# Patient Record
Sex: Male | Born: 1935 | Race: White | Hispanic: No | Marital: Married | Smoking: Never smoker
Health system: Southern US, Community
[De-identification: ages and names within clinical notes are randomized; demographics above are authoritative.]

## PROBLEM LIST (undated history)

## (undated) DIAGNOSIS — M6281 Muscle weakness (generalized): Secondary | ICD-10-CM

## (undated) DIAGNOSIS — R41841 Cognitive communication deficit: Secondary | ICD-10-CM

## (undated) DIAGNOSIS — E785 Hyperlipidemia, unspecified: Secondary | ICD-10-CM

## (undated) DIAGNOSIS — F0151 Vascular dementia with behavioral disturbance: Secondary | ICD-10-CM

## (undated) DIAGNOSIS — E119 Type 2 diabetes mellitus without complications: Secondary | ICD-10-CM

## (undated) DIAGNOSIS — H9190 Unspecified hearing loss, unspecified ear: Secondary | ICD-10-CM

## (undated) DIAGNOSIS — E079 Disorder of thyroid, unspecified: Secondary | ICD-10-CM

## (undated) DIAGNOSIS — Z9181 History of falling: Secondary | ICD-10-CM

## (undated) DIAGNOSIS — N200 Calculus of kidney: Secondary | ICD-10-CM

## (undated) DIAGNOSIS — F0153 Vascular dementia, unspecified severity, with mood disturbance: Secondary | ICD-10-CM

## (undated) DIAGNOSIS — M1990 Unspecified osteoarthritis, unspecified site: Secondary | ICD-10-CM

## (undated) DIAGNOSIS — I2510 Atherosclerotic heart disease of native coronary artery without angina pectoris: Secondary | ICD-10-CM

## (undated) DIAGNOSIS — R531 Weakness: Secondary | ICD-10-CM

## (undated) DIAGNOSIS — R2681 Unsteadiness on feet: Secondary | ICD-10-CM

## (undated) DIAGNOSIS — R1311 Dysphagia, oral phase: Secondary | ICD-10-CM

## (undated) DIAGNOSIS — F329 Major depressive disorder, single episode, unspecified: Secondary | ICD-10-CM

## (undated) DIAGNOSIS — F32A Depression, unspecified: Secondary | ICD-10-CM

## (undated) DIAGNOSIS — F0150 Vascular dementia without behavioral disturbance: Secondary | ICD-10-CM

## (undated) DIAGNOSIS — F03918 Unspecified dementia, unspecified severity, with other behavioral disturbance: Secondary | ICD-10-CM

## (undated) DIAGNOSIS — F0391 Unspecified dementia with behavioral disturbance: Secondary | ICD-10-CM

## (undated) DIAGNOSIS — N400 Enlarged prostate without lower urinary tract symptoms: Secondary | ICD-10-CM

## (undated) HISTORY — PX: TRANSURETHRAL RESECTION OF PROSTATE: SHX73

## (undated) HISTORY — PX: OTHER SURGICAL HISTORY: SHX169

## (undated) HISTORY — PX: TOTAL KNEE ARTHROPLASTY: SHX125

## (undated) HISTORY — PX: CORONARY ARTERY BYPASS GRAFT: SHX141

---

## 1997-08-17 ENCOUNTER — Encounter: Payer: Self-pay | Admitting: Cardiology

## 2001-04-14 ENCOUNTER — Ambulatory Visit (HOSPITAL_COMMUNITY): Admission: RE | Admit: 2001-04-14 | Discharge: 2001-04-14 | Payer: Self-pay | Admitting: Family Medicine

## 2001-04-14 ENCOUNTER — Encounter: Payer: Self-pay | Admitting: Family Medicine

## 2001-04-26 ENCOUNTER — Ambulatory Visit (HOSPITAL_COMMUNITY): Admission: RE | Admit: 2001-04-26 | Discharge: 2001-04-26 | Payer: Self-pay | Admitting: Urology

## 2001-04-26 ENCOUNTER — Encounter: Payer: Self-pay | Admitting: Urology

## 2001-05-04 ENCOUNTER — Ambulatory Visit (HOSPITAL_COMMUNITY): Admission: RE | Admit: 2001-05-04 | Discharge: 2001-05-04 | Payer: Self-pay | Admitting: Urology

## 2001-05-09 ENCOUNTER — Encounter: Payer: Self-pay | Admitting: Urology

## 2001-05-09 ENCOUNTER — Ambulatory Visit (HOSPITAL_COMMUNITY): Admission: RE | Admit: 2001-05-09 | Discharge: 2001-05-09 | Payer: Self-pay

## 2001-06-24 ENCOUNTER — Encounter: Payer: Self-pay | Admitting: Urology

## 2001-06-24 ENCOUNTER — Ambulatory Visit (HOSPITAL_COMMUNITY): Admission: RE | Admit: 2001-06-24 | Discharge: 2001-06-24 | Payer: Self-pay | Admitting: Urology

## 2001-09-21 ENCOUNTER — Ambulatory Visit (HOSPITAL_COMMUNITY): Admission: RE | Admit: 2001-09-21 | Discharge: 2001-09-21 | Payer: Self-pay | Admitting: Urology

## 2001-09-21 ENCOUNTER — Encounter: Payer: Self-pay | Admitting: Urology

## 2001-09-28 ENCOUNTER — Encounter: Payer: Self-pay | Admitting: Urology

## 2001-09-28 ENCOUNTER — Ambulatory Visit (HOSPITAL_COMMUNITY): Admission: RE | Admit: 2001-09-28 | Discharge: 2001-09-28 | Payer: Self-pay | Admitting: Urology

## 2001-12-01 ENCOUNTER — Encounter: Payer: Self-pay | Admitting: Cardiology

## 2001-12-27 ENCOUNTER — Ambulatory Visit (HOSPITAL_COMMUNITY): Admission: RE | Admit: 2001-12-27 | Discharge: 2001-12-27 | Payer: Self-pay | Admitting: Cardiology

## 2002-02-07 ENCOUNTER — Encounter: Payer: Self-pay | Admitting: Family Medicine

## 2002-02-07 ENCOUNTER — Ambulatory Visit (HOSPITAL_COMMUNITY): Admission: RE | Admit: 2002-02-07 | Discharge: 2002-02-07 | Payer: Self-pay | Admitting: Family Medicine

## 2002-05-25 ENCOUNTER — Ambulatory Visit (HOSPITAL_COMMUNITY): Admission: RE | Admit: 2002-05-25 | Discharge: 2002-05-25 | Payer: Self-pay | Admitting: Internal Medicine

## 2003-08-15 ENCOUNTER — Ambulatory Visit (HOSPITAL_COMMUNITY): Admission: RE | Admit: 2003-08-15 | Discharge: 2003-08-15 | Payer: Self-pay | Admitting: Family Medicine

## 2003-10-09 ENCOUNTER — Ambulatory Visit (HOSPITAL_COMMUNITY): Admission: RE | Admit: 2003-10-09 | Discharge: 2003-10-09 | Payer: Self-pay | Admitting: Family Medicine

## 2003-10-17 ENCOUNTER — Ambulatory Visit (HOSPITAL_COMMUNITY): Admission: RE | Admit: 2003-10-17 | Discharge: 2003-10-17 | Payer: Self-pay | Admitting: Family Medicine

## 2003-11-07 ENCOUNTER — Encounter: Admission: RE | Admit: 2003-11-07 | Discharge: 2003-11-07 | Payer: Self-pay | Admitting: Orthopedic Surgery

## 2003-11-22 ENCOUNTER — Encounter: Admission: RE | Admit: 2003-11-22 | Discharge: 2003-11-22 | Payer: Self-pay | Admitting: Orthopedic Surgery

## 2003-11-27 ENCOUNTER — Ambulatory Visit (HOSPITAL_COMMUNITY): Admission: RE | Admit: 2003-11-27 | Discharge: 2003-11-27 | Payer: Self-pay | Admitting: Family Medicine

## 2003-11-30 ENCOUNTER — Ambulatory Visit (HOSPITAL_COMMUNITY): Admission: RE | Admit: 2003-11-30 | Discharge: 2003-11-30 | Payer: Self-pay | Admitting: Family Medicine

## 2003-12-12 ENCOUNTER — Encounter: Admission: RE | Admit: 2003-12-12 | Discharge: 2003-12-12 | Payer: Self-pay | Admitting: Orthopedic Surgery

## 2004-02-13 ENCOUNTER — Ambulatory Visit (HOSPITAL_COMMUNITY): Admission: RE | Admit: 2004-02-13 | Discharge: 2004-02-13 | Payer: Self-pay | Admitting: Family Medicine

## 2004-09-02 ENCOUNTER — Ambulatory Visit (HOSPITAL_COMMUNITY): Admission: RE | Admit: 2004-09-02 | Discharge: 2004-09-02 | Payer: Self-pay | Admitting: Family Medicine

## 2004-09-23 ENCOUNTER — Ambulatory Visit (HOSPITAL_COMMUNITY): Admission: RE | Admit: 2004-09-23 | Discharge: 2004-09-23 | Payer: Self-pay | Admitting: Family Medicine

## 2004-09-23 IMAGING — CT CT HEAD W/O CM
1 series · 15 of 30 positions shown, 19 images · non-contrast
Comparison: none

CLINICAL DATA: New left sided weakness.  
CT HEAD WITHOUT CONTRAST
Multidetector helical CT scanning obtained from the skull base to the vertex.
Moderate to severe decreased attenuation of periventricular white matter is compatible with chronic ischemic changes of a microvascular nature.  Remote lacunar infarcts in bilateral cerebellum, bilateral basal ganglia and thalami are noted.  Mild to moderate generalized cerebral and cerebellar volume loss is noted.  No definite acute abnormality identified including mass or mass effect, hydrocephalus, extraaxial fluid collection, midline shift, hemorrhage, acute infarct. Acute infarct may be missed by CT for 24-48 hours.  Atherosclerotic calcification in the vertebral basilar and visualized internal carotid arteries are noted.  Visualized bony calvarium and paranasal sinuses are unremarkable.  There is fluid in scattered right mastoid air cells.
IMPRESSION
1.  No definite acute intracranial abnormality.  
2.  Remote lacunar infarcts in bilateral basal ganglia, thalami, and cerebellum.
3.  Mild to moderate atrophy and moderate to severe chronic small vessel white matter changes.
4.  Small amount of fluid in right mastoid air cells.

[Series 9234: — · axial · 0.49mm/px · z∈[-618,-483]mm · 15 of 30 slices shown, 19 images]
[im 2/30  brain]
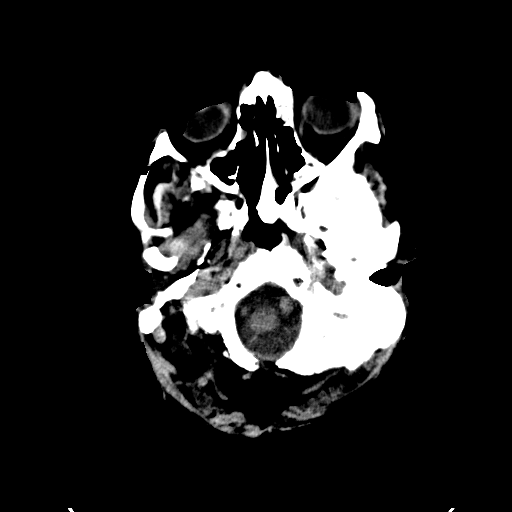
[im 2/30  bone]
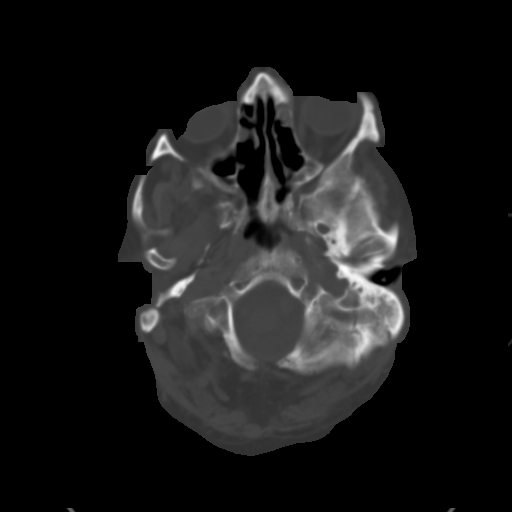
[im 4/30  brain]
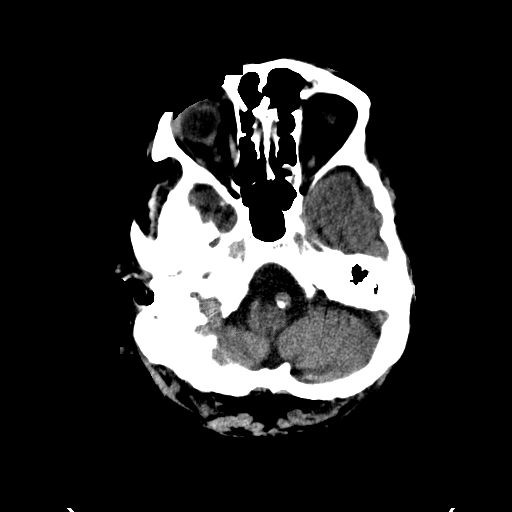
[im 6/30  brain]
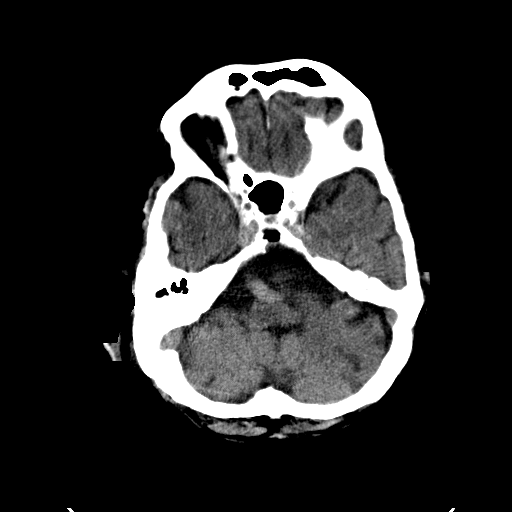
[im 8/30  brain]
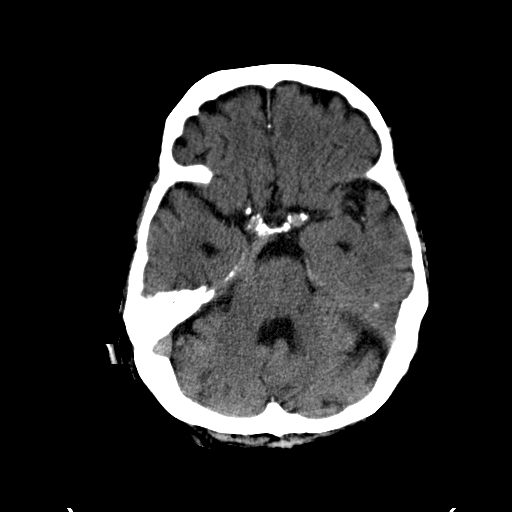
[im 10/30  brain]
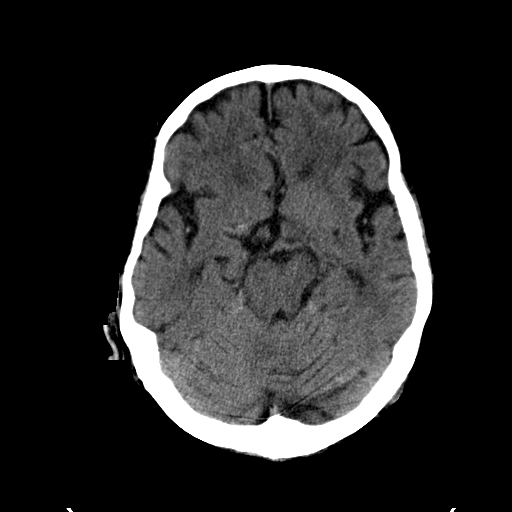
[im 10/30  bone]
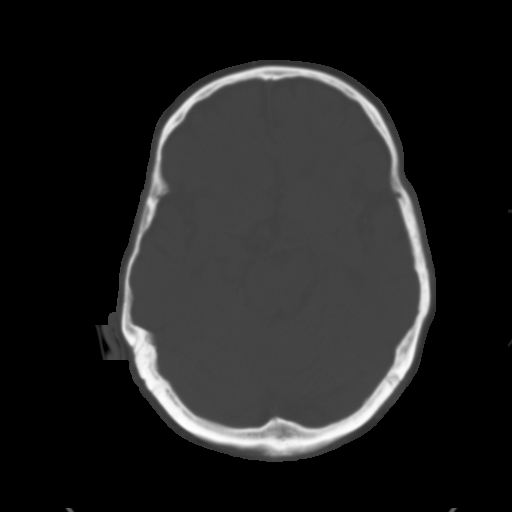
[im 12/30  brain]
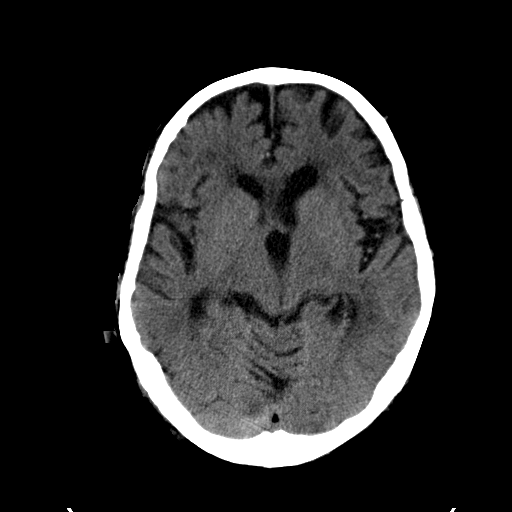
[im 14/30  brain]
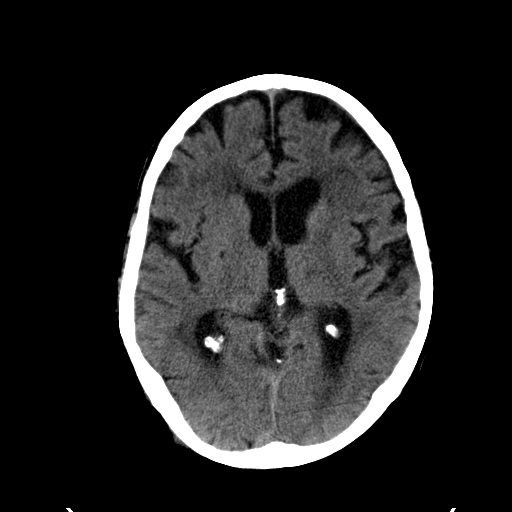
[im 16/30  brain]
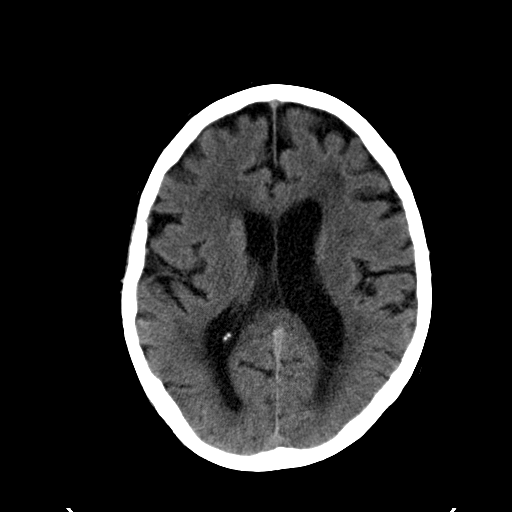
[im 17/30  brain]
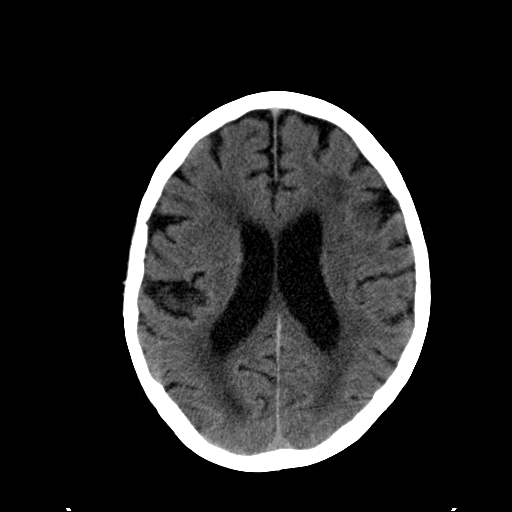
[im 17/30  bone]
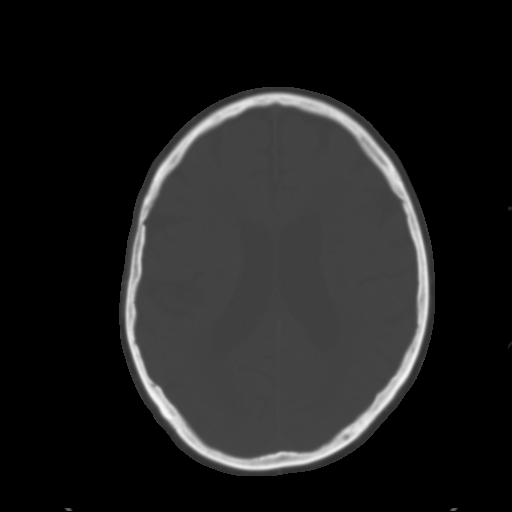
[im 19/30  brain]
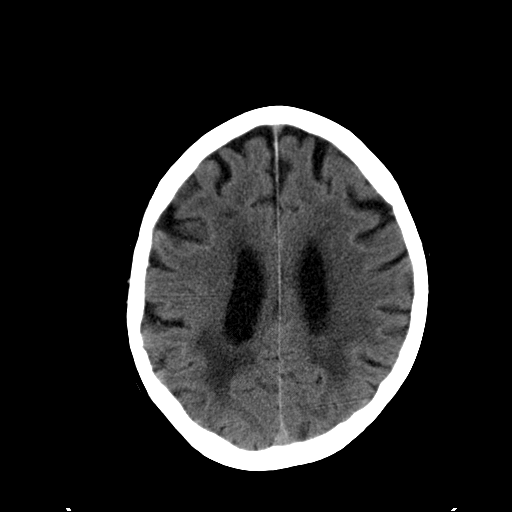
[im 21/30  brain]
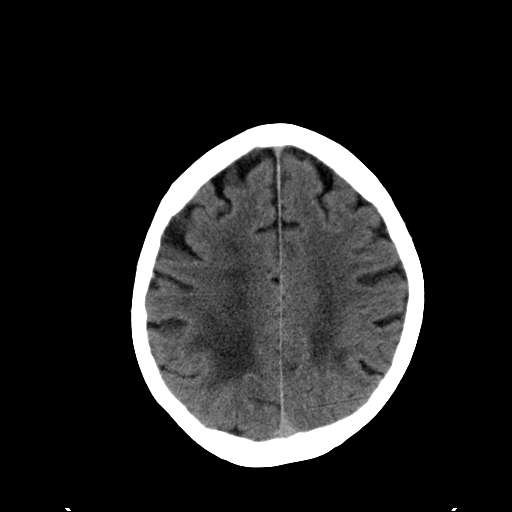
[im 23/30  brain]
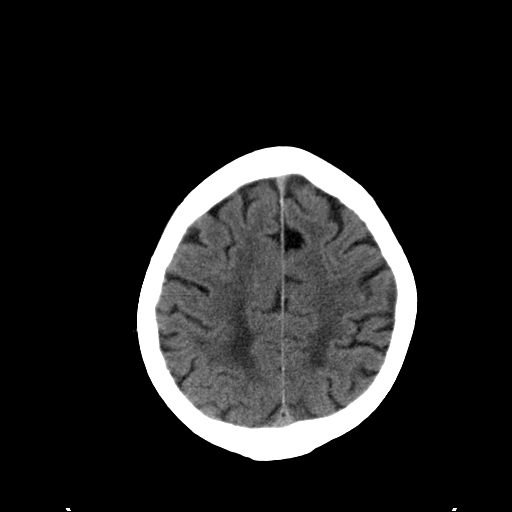
[im 25/30  brain]
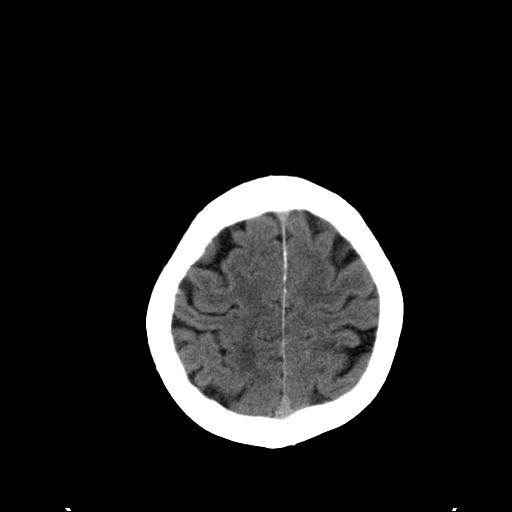
[im 25/30  bone]
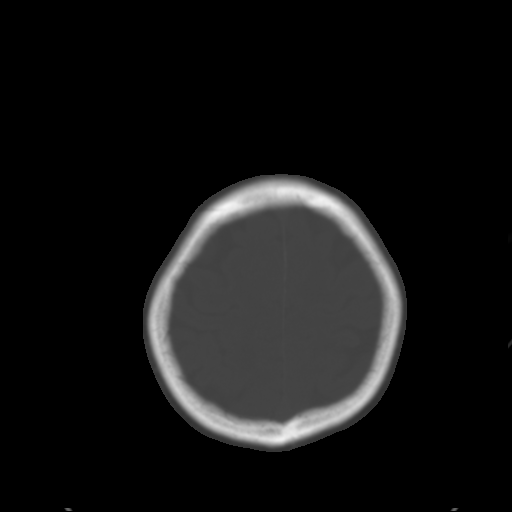
[im 27/30  brain]
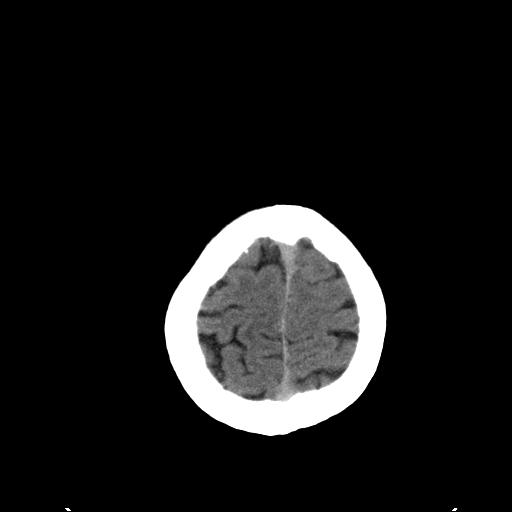
[im 29/30  brain]
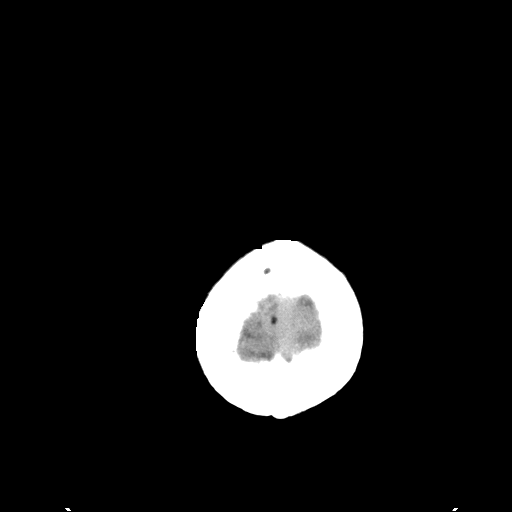

[15 of 30 positions shown; findings below may reference images not displayed]

## 2004-09-30 ENCOUNTER — Ambulatory Visit (HOSPITAL_COMMUNITY): Admission: RE | Admit: 2004-09-30 | Discharge: 2004-09-30 | Payer: Self-pay | Admitting: Urology

## 2005-07-17 ENCOUNTER — Ambulatory Visit: Payer: Self-pay | Admitting: *Deleted

## 2005-07-28 IMAGING — CT CT ABDOMEN W/O CM
1 of 2 series · 15 of 32 positions shown, 19 images · non-contrast
Comparison: none

HISTORY: Left flank pain, history of kidney stones

[Series 8423: — · axial · 0.74mm/px · z∈[+1306,+1676]mm · 15 of 83 slices shown, 19 images]
[im 6/83  soft-tissue]
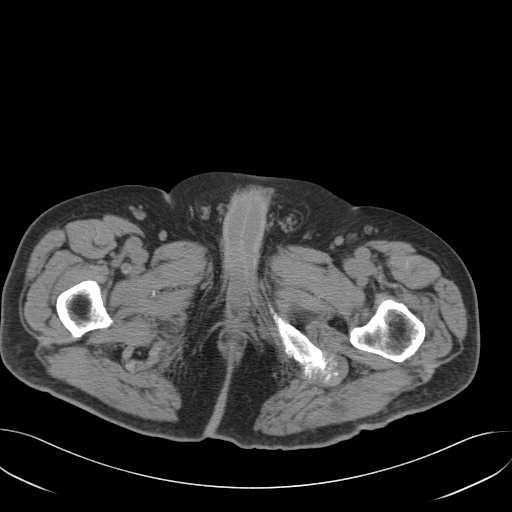
[im 6/83  bone]
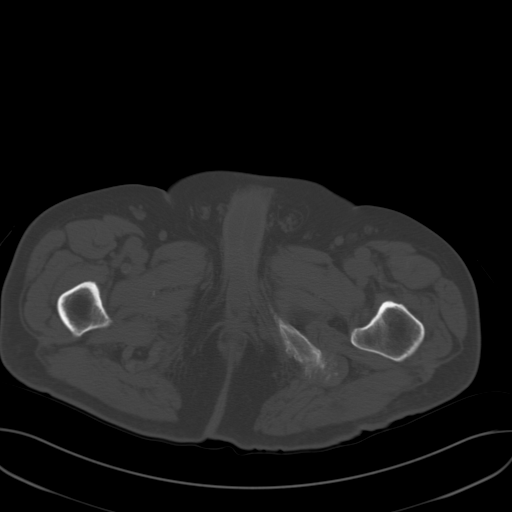
[im 12/83  soft-tissue]
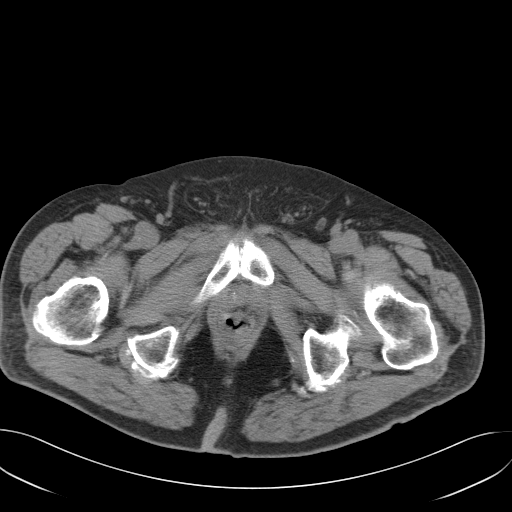
[im 18/83  soft-tissue]
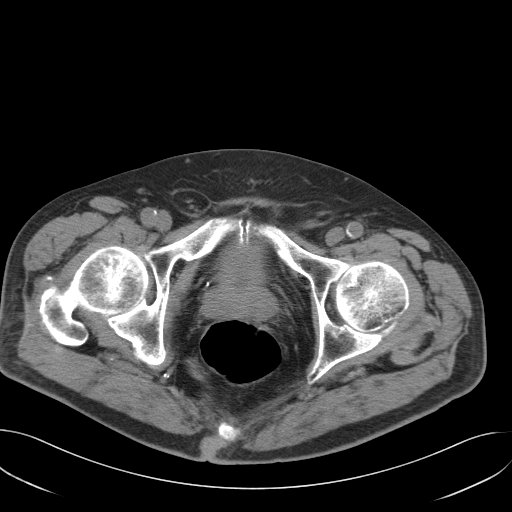
[im 24/83  soft-tissue]
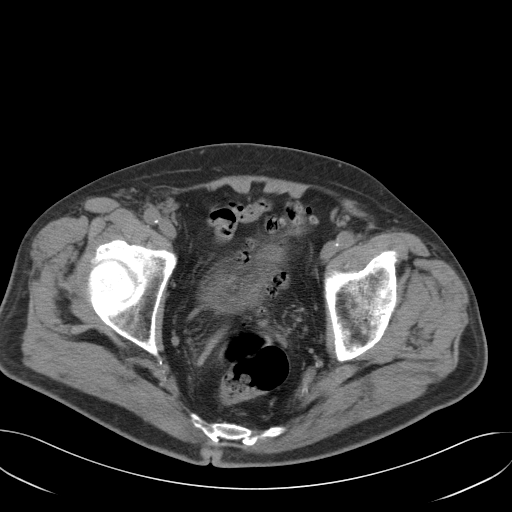
[im 30/83  soft-tissue]
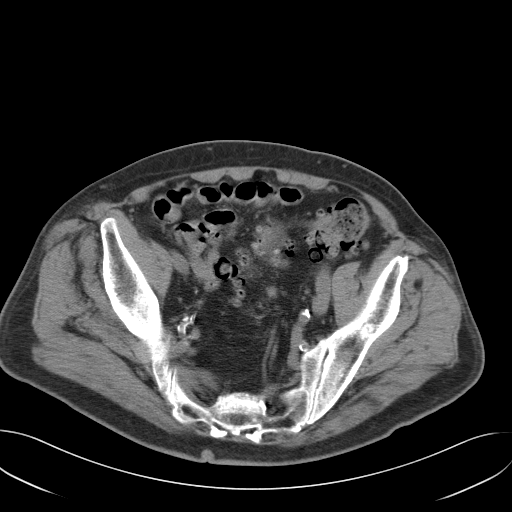
[im 36/83  soft-tissue]
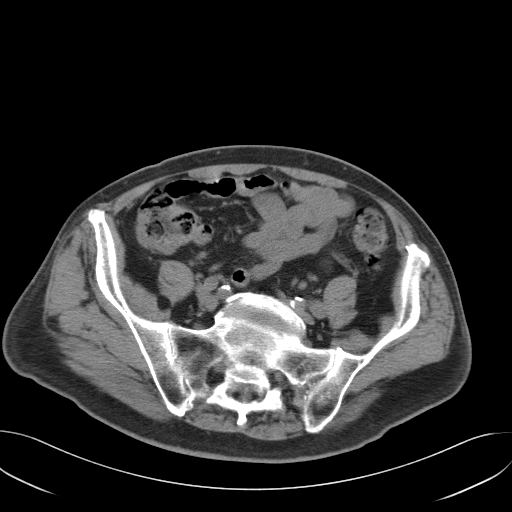
[im 42/83  soft-tissue]
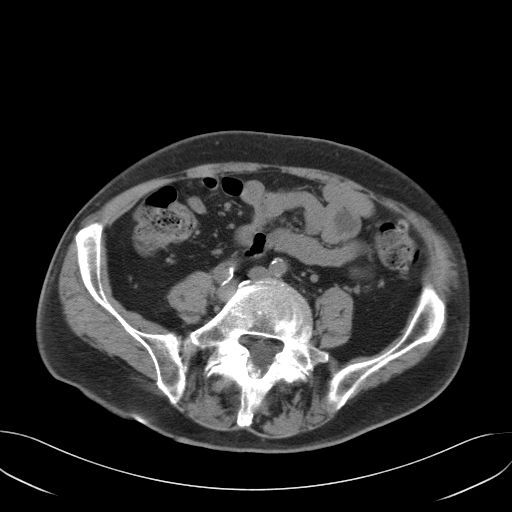
[im 47/83  soft-tissue]
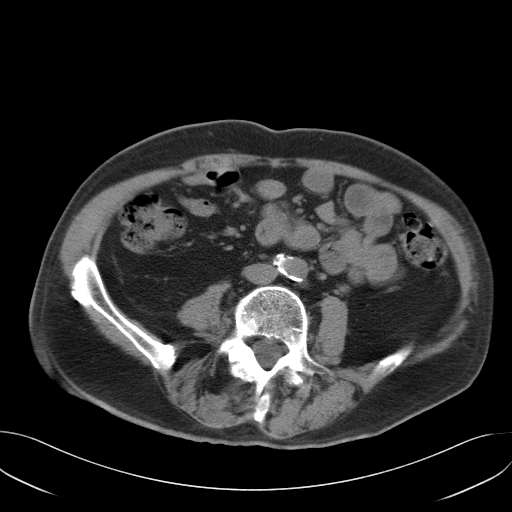
[im 53/83  soft-tissue]
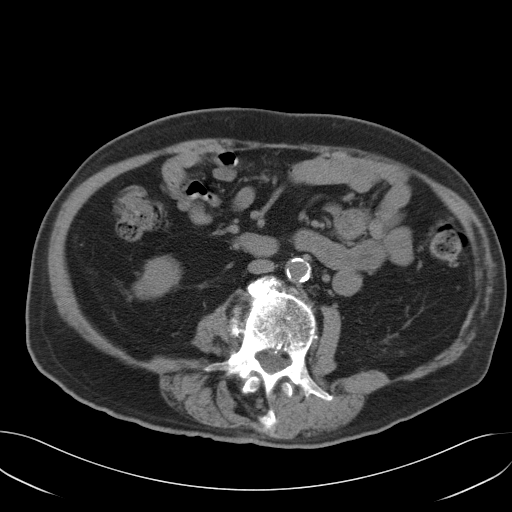
[im 53/83  bone]
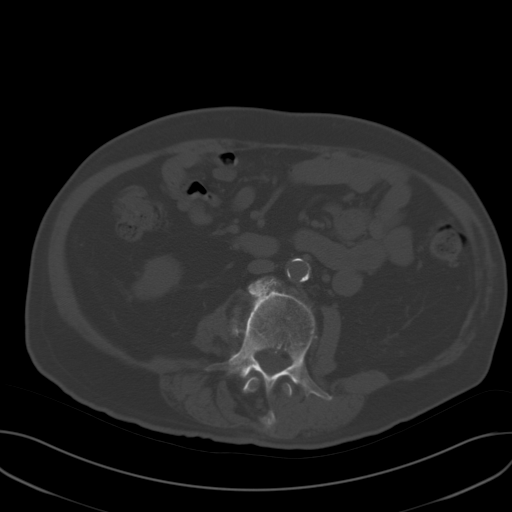
[im 59/83  soft-tissue]
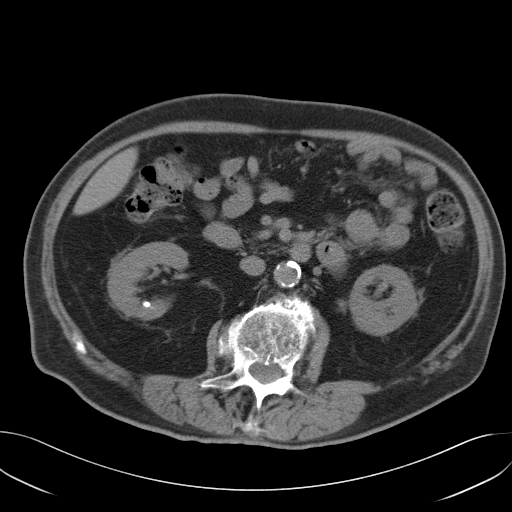
[im 65/83  soft-tissue]
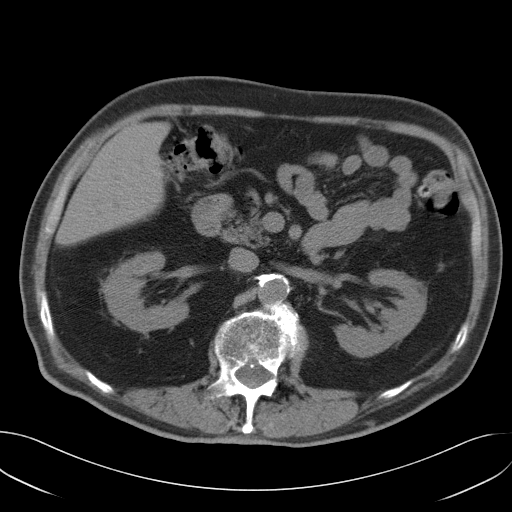
[im 71/83  soft-tissue]
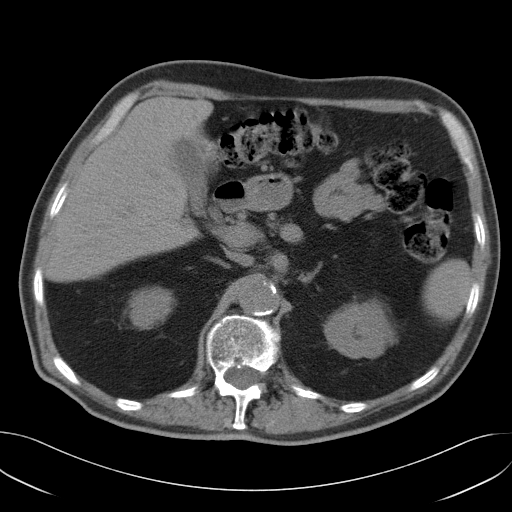
[im 71/83  lung]
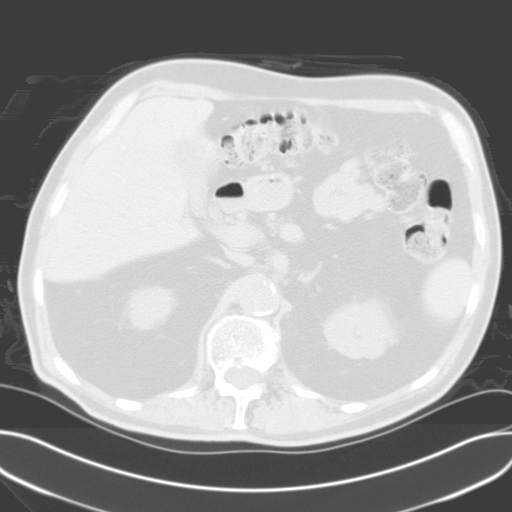
[im 74/83  lung]
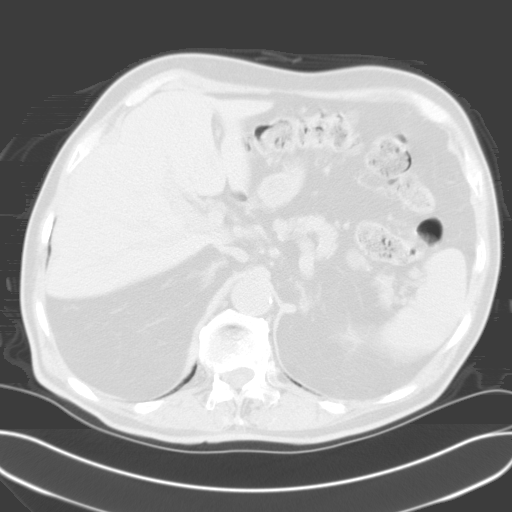
[im 77/83  soft-tissue]
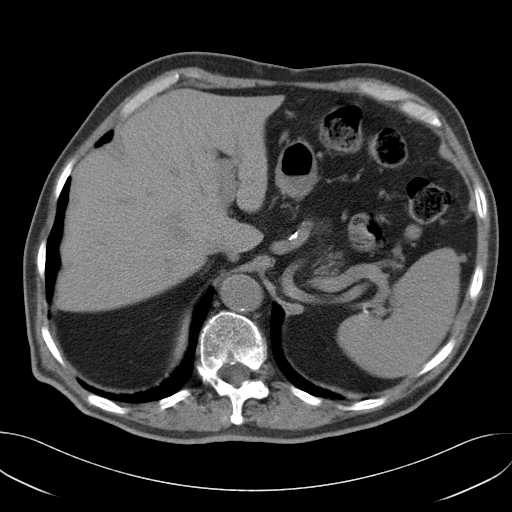
[im 77/83  lung]
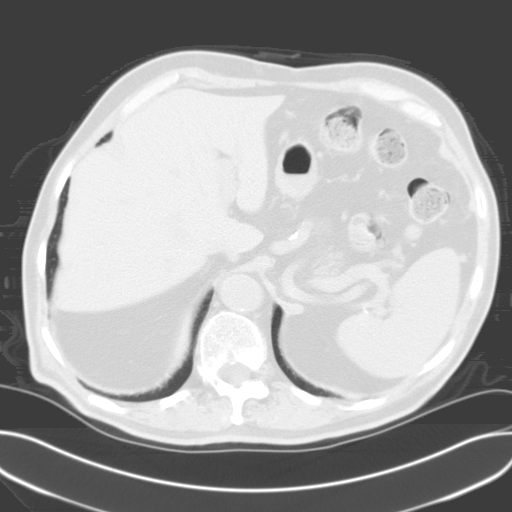
[im 80/83  lung]
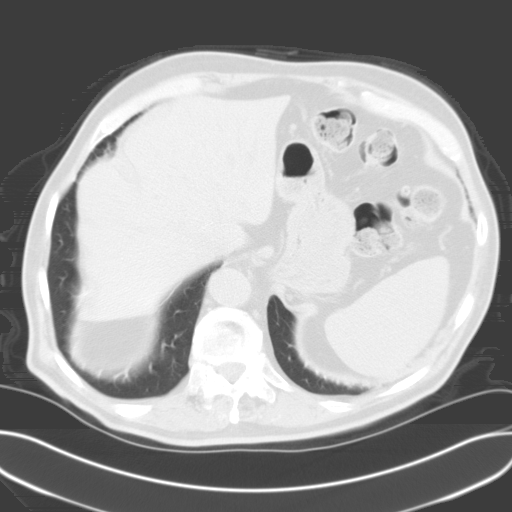

[15 of 32 positions shown; findings below may reference images not displayed]

CT ABDOMEN AND PELVIS WITHOUT CONTRAST:

Multidetector helical CT imaging of abdomen and pelvis performed using kidney
stone protocol. 
Neither oral nor intravenous contrast utilized for this indication.

Comparison 08/15/2003

CT ABDOMEN:

Small hiatal hernia.
2 right renal calculi without hydronephrosis.
No left renal calculi or hydronephrosis.
No ureteral dilatation or perinephric fluid collection.
Scattered atherosclerotic calcifications.
Remaining solid organs and bowel loops unremarkable for exam lacking IV and oral
contrast.
IMPRESSION: Right renal calculi. No acute abnormalities.

CT PELVIS:

Extensive diverticulosis sigmoid colon.
No definite pericolic inflammatory changes to suggest acute diverticulitis.
No distal ureteral calcification or dilatation.
Question minimal hazy infiltration of fat planes between the sigmoid colon and
superior aspect of bladder, unchanged since the previous exam.
Linear low attenuation identified in left lateral wall of urinary bladder,
unchanged, measuring fat in attenuation, a nonspecific finding of uncertain
significance.
Extensive diverticulitis changes of sigmoid colon seen on previous exam no
longer identified.
IMPRESSION: Extensive sigmoid diverticulosis without signs of diverticulitis.
No distal ureteral calcification, dilatation or hydronephrosis.
Unusual appearance of left lateral bladder wall, stable.
Minimal chronic stranding of pericolic and perivesicular fat planes in pelvis.

## 2006-10-08 ENCOUNTER — Ambulatory Visit: Payer: Self-pay | Admitting: Cardiovascular Disease

## 2008-03-29 ENCOUNTER — Ambulatory Visit (HOSPITAL_COMMUNITY): Admission: RE | Admit: 2008-03-29 | Discharge: 2008-03-29 | Payer: Self-pay | Admitting: Family Medicine

## 2008-08-14 ENCOUNTER — Ambulatory Visit (HOSPITAL_COMMUNITY): Admission: RE | Admit: 2008-08-14 | Discharge: 2008-08-14 | Payer: Self-pay | Admitting: Family Medicine

## 2008-09-24 ENCOUNTER — Ambulatory Visit (HOSPITAL_COMMUNITY): Admission: RE | Admit: 2008-09-24 | Discharge: 2008-09-24 | Payer: Self-pay | Admitting: Family Medicine

## 2009-02-26 DIAGNOSIS — I2119 ST elevation (STEMI) myocardial infarction involving other coronary artery of inferior wall: Secondary | ICD-10-CM | POA: Insufficient documentation

## 2009-02-26 DIAGNOSIS — Z951 Presence of aortocoronary bypass graft: Secondary | ICD-10-CM

## 2009-02-26 DIAGNOSIS — Z794 Long term (current) use of insulin: Secondary | ICD-10-CM

## 2009-02-26 DIAGNOSIS — E785 Hyperlipidemia, unspecified: Secondary | ICD-10-CM

## 2009-02-26 DIAGNOSIS — E119 Type 2 diabetes mellitus without complications: Secondary | ICD-10-CM

## 2009-08-19 ENCOUNTER — Ambulatory Visit (HOSPITAL_COMMUNITY): Admission: RE | Admit: 2009-08-19 | Discharge: 2009-08-19 | Payer: Self-pay | Admitting: Family Medicine

## 2010-02-28 ENCOUNTER — Ambulatory Visit (HOSPITAL_COMMUNITY): Admission: RE | Admit: 2010-02-28 | Discharge: 2010-02-28 | Payer: Self-pay | Admitting: Family Medicine

## 2010-04-03 ENCOUNTER — Ambulatory Visit (HOSPITAL_COMMUNITY): Admission: RE | Admit: 2010-04-03 | Discharge: 2010-04-03 | Payer: Self-pay | Admitting: Family Medicine

## 2010-07-05 ENCOUNTER — Emergency Department (HOSPITAL_COMMUNITY)
Admission: EM | Admit: 2010-07-05 | Discharge: 2010-07-05 | Payer: Self-pay | Source: Home / Self Care | Admitting: Emergency Medicine

## 2010-07-08 ENCOUNTER — Ambulatory Visit (HOSPITAL_COMMUNITY): Admission: RE | Admit: 2010-07-08 | Discharge: 2010-07-08 | Payer: Self-pay | Admitting: Urology

## 2010-07-15 ENCOUNTER — Ambulatory Visit (HOSPITAL_COMMUNITY): Admission: RE | Admit: 2010-07-15 | Discharge: 2010-07-15 | Payer: Self-pay | Admitting: Urology

## 2010-09-21 ENCOUNTER — Encounter: Payer: Self-pay | Admitting: Family Medicine

## 2010-11-11 LAB — URINE MICROSCOPIC-ADD ON

## 2010-11-11 LAB — BASIC METABOLIC PANEL
Calcium: 9.5 mg/dL (ref 8.4–10.5)
Calcium: 9.8 mg/dL (ref 8.4–10.5)
Chloride: 100 mEq/L (ref 96–112)
GFR calc Af Amer: 46 mL/min — ABNORMAL LOW (ref >60)
GFR calc non Af Amer: 30 mL/min — ABNORMAL LOW (ref >60)
Glucose, Bld: 143 mg/dL — ABNORMAL HIGH (ref 70–99)
Glucose, Bld: 158 mg/dL — ABNORMAL HIGH (ref 70–99)
Potassium: 4.6 mEq/L (ref 3.5–5.1)

## 2010-11-11 LAB — URINALYSIS, ROUTINE W REFLEX MICROSCOPIC
Glucose, UA: NEGATIVE mg/dL
Ketones, ur: NEGATIVE mg/dL
Nitrite: NEGATIVE
Urobilinogen, UA: 0.2 mg/dL (ref 0.0–1.0)

## 2010-11-11 LAB — CBC
Hemoglobin: 10.6 g/dL — ABNORMAL LOW (ref 13.0–17.0)
Hemoglobin: 9.8 g/dL — ABNORMAL LOW (ref 13.0–17.0)
MCH: 28.8 pg (ref 26.0–34.0)
MCH: 29.2 pg (ref 26.0–34.0)
RBC: 3.62 MIL/uL — ABNORMAL LOW (ref 4.22–5.81)
RDW: 15.2 % (ref 11.5–15.5)
RDW: 15.4 % (ref 11.5–15.5)
WBC: 5.3 10*3/uL (ref 4.0–10.5)

## 2010-11-11 LAB — DIFFERENTIAL
Eosinophils Relative: 1 % (ref 0–5)
Lymphocytes Relative: 12 % (ref 12–46)
Monocytes Absolute: 0.6 10*3/uL (ref 0.1–1.0)
Monocytes Relative: 7 % (ref 3–12)
Neutro Abs: 6.3 10*3/uL (ref 1.7–7.7)
Neutrophils Relative %: 80 % — ABNORMAL HIGH (ref 43–77)

## 2010-11-11 LAB — GLUCOSE, CAPILLARY: Glucose-Capillary: 108 mg/dL — ABNORMAL HIGH (ref 70–99)

## 2010-11-11 LAB — SURGICAL PCR SCREEN: MRSA, PCR: NEGATIVE

## 2011-01-16 NOTE — Assessment & Plan Note (Signed)
[facility]                       [place] [facility] OFFICE NOTE   NAME:Servais, Kojo                       MRN:          [id number]  DATE:10/08/2006                            DOB:          11-14-1920    Mr. Rodier is seen today as a new patient to me.  He has had previous  bypass surgery in August 1998.  This included LIMA to the LAD, vein  graft to OM1 and 2, vein graft to PDA and vein graft to the diagonal.  His last Myoview was five years ago and was negative for ischemia with  an EF of 47%.  The patient is a diabetic.  He sees Dr. Renard Matter.  He was  asking me about his Avandia.  He has been on it for four or five years.  The primary problem with Avandia would be fluid retention and, given his  advanced age and the fact that he has tolerated this drug over the last  four to five years, I do not think I would necessarily change it.  Similarly, he has been on Vytorin and I do not think that he should stop  the Zetia.   From a cardiac perspective, he is not having significant chest pain, PND  or orthopnea.  He has not had any significant palpitations.   MEDICATIONS INCLUDE:  1. Avandia 8 mg a day.  2. An aspirin a day.  3. Vytorin 10/40.  4. Avalide 150/12.5.  5. Metformin 1 g a day.   Today, in the office, his blood pressure was 130/70, pulse was 80 and  regular.  HEENT is normal.  There are no carotid bruits.  There is an  S1, S2, normal heart sounds.  Abdomen is benign.  Lower extremities with  intact pulses, no edema.   IMPRESSION:  Stable, status post CABG in 1998.  The patient is 77 years  old.  He is asymptomatic, although he is a diabetic and I am tempted to  order a stress test on him.  I will see him back in six months.  It is  difficult to make the patient feel any better than he already is.  We  will continue aggressive secondary prophylaxis.  He will continue his  aspirin therapy.  He will continue Vytorin for his  hyperlipidemia.   His last LDL cholesterol that I have on file was 90.   In regards to his medication, we will not stop his Zetia or Avandia,  since he has tolerated them for many years.   He knows to call us if he were to have any significant exertional  angina.   Overall, for his age, I think he is doing very well.     Noralyn Pick. Eden Emms, MD, [facility]  Electronically Signed    PCN/MedQ  DD: 10/08/2006  DT: 10/08/2006  Job #: [id number]

## 2011-01-16 NOTE — Procedures (Signed)
NAME:  James Pruitt, James Pruitt                          ACCOUNT NO.:  [id number]   MEDICAL RECORD NO.:  [id number]                   PATIENT TYPE:  OUT   LOCATION:  RAD                                  FACILITY:  [facility]   PHYSICIAN:  Vida Roller, M.D.                DATE OF BIRTH:  September 11, 1920   DATE OF PROCEDURE:  DATE OF DISCHARGE:                                  ECHOCARDIOGRAM   TAPE NUMBER:  [id number], tape count [id number] through 2926.   INDICATION:  An 77 year old man with a TIA.   TECHNICAL QUALITY:  Adequate.   M-MODE TRACING:  1. The aorta is 39 mm at the area below the sinotubular junction.  2. The left atrium is 37 mm.  3. The septum is 20 mm.  4. Posterior wall is 13 mm.  5. Left ventricular diastolic dimension is 42 mm.  6. Left ventricular systolic dimension is 29 mm.   2-D AND DOPPLER IMAGING:  1. The left ventricle is normal size with mild upper septal hypertrophy.     There is also mild concentric ventricular hypertrophy.  The overall     ejection fraction appears to be preserved at 55 to 60 percent.  There is     hypokinesis of the mid to distal septum seen best in the apical three-     chamber view.  2. The right ventricle is normal size with normal systolic function.  3. Both atria are top normal in size.  4. The aortic valve is sclerotic.  There is evidence of mild insufficiency.     No stenosis is seen.  5. The mitral valve is mildly calcified with no significant stenosis or     regurgitation.  6. The tricuspid valve has mild insufficiency.  7. The pulmonic valve was not well seen.  8. The pericardial structures appeared normal.  9. The inferior vena cava appeared to be normal size.  10.      The ascending aorta appeared to be dilated.  In the suprasternal     notch view, it is difficult to assess, but it appears to be dilated to     the arch.  I was unable to measure however.  Recommendation would be for     a CT scan to assess further.      ___________________________________________                                            Vida Roller, M.D.   JH/MEDQ  D:  11/30/2003  T:  11/30/2003  Job:  [id number]

## 2012-02-05 ENCOUNTER — Ambulatory Visit (HOSPITAL_COMMUNITY)
Admission: RE | Admit: 2012-02-05 | Discharge: 2012-02-05 | Disposition: A | Discharge status: Home / Self Care | Payer: Medicare Other | Source: Ambulatory Visit | Attending: Family Medicine | Admitting: Family Medicine

## 2012-02-05 ENCOUNTER — Other Ambulatory Visit (HOSPITAL_COMMUNITY): Payer: Self-pay | Admitting: Family Medicine

## 2012-02-05 DIAGNOSIS — R05 Cough: Secondary | ICD-10-CM

## 2012-02-05 DIAGNOSIS — R0602 Shortness of breath: Secondary | ICD-10-CM | POA: Insufficient documentation

## 2012-02-05 DIAGNOSIS — R059 Cough, unspecified: Secondary | ICD-10-CM | POA: Insufficient documentation

## 2012-02-05 DIAGNOSIS — R0600 Dyspnea, unspecified: Secondary | ICD-10-CM

## 2012-03-17 ENCOUNTER — Ambulatory Visit (HOSPITAL_COMMUNITY)
Admission: RE | Admit: 2012-03-17 | Discharge: 2012-03-17 | Disposition: A | Discharge status: Home / Self Care | Payer: Medicare Other | Source: Ambulatory Visit | Attending: Family Medicine | Admitting: Family Medicine

## 2012-03-17 ENCOUNTER — Other Ambulatory Visit (HOSPITAL_COMMUNITY): Payer: Self-pay | Admitting: Family Medicine

## 2012-03-17 DIAGNOSIS — S63509A Unspecified sprain of unspecified wrist, initial encounter: Secondary | ICD-10-CM | POA: Insufficient documentation

## 2012-03-17 DIAGNOSIS — G8929 Other chronic pain: Secondary | ICD-10-CM

## 2012-03-17 DIAGNOSIS — X58XXXA Exposure to other specified factors, initial encounter: Secondary | ICD-10-CM | POA: Insufficient documentation

## 2012-09-05 ENCOUNTER — Encounter (HOSPITAL_COMMUNITY): Payer: Self-pay | Admitting: Pharmacy Technician

## 2012-09-12 ENCOUNTER — Encounter (HOSPITAL_COMMUNITY): Admission: RE | Payer: Self-pay | Source: Ambulatory Visit

## 2012-09-12 ENCOUNTER — Ambulatory Visit (HOSPITAL_COMMUNITY): Admission: RE | Admit: 2012-09-12 | Payer: Medicare Other | Source: Ambulatory Visit | Admitting: Ophthalmology

## 2012-09-12 SURGERY — PHACOEMULSIFICATION, CATARACT, WITH IOL INSERTION
Anesthesia: Monitor Anesthesia Care | Site: Eye | Laterality: Left

## 2012-10-27 ENCOUNTER — Encounter (HOSPITAL_COMMUNITY): Payer: Self-pay | Admitting: *Deleted

## 2012-10-27 ENCOUNTER — Emergency Department (HOSPITAL_COMMUNITY)
Admission: EM | Admit: 2012-10-27 | Discharge: 2012-10-27 | Disposition: A | Discharge status: Home / Self Care | Payer: Medicare Other | Attending: Emergency Medicine | Admitting: Emergency Medicine

## 2012-10-27 ENCOUNTER — Emergency Department (HOSPITAL_COMMUNITY): Payer: Medicare Other

## 2012-10-27 DIAGNOSIS — K4090 Unilateral inguinal hernia, without obstruction or gangrene, not specified as recurrent: Secondary | ICD-10-CM | POA: Insufficient documentation

## 2012-10-27 DIAGNOSIS — I10 Essential (primary) hypertension: Secondary | ICD-10-CM | POA: Insufficient documentation

## 2012-10-27 DIAGNOSIS — Z87442 Personal history of urinary calculi: Secondary | ICD-10-CM | POA: Insufficient documentation

## 2012-10-27 DIAGNOSIS — R1084 Generalized abdominal pain: Secondary | ICD-10-CM | POA: Insufficient documentation

## 2012-10-27 DIAGNOSIS — Z87448 Personal history of other diseases of urinary system: Secondary | ICD-10-CM | POA: Insufficient documentation

## 2012-10-27 DIAGNOSIS — E785 Hyperlipidemia, unspecified: Secondary | ICD-10-CM | POA: Insufficient documentation

## 2012-10-27 DIAGNOSIS — Z79899 Other long term (current) drug therapy: Secondary | ICD-10-CM | POA: Insufficient documentation

## 2012-10-27 DIAGNOSIS — Z7982 Long term (current) use of aspirin: Secondary | ICD-10-CM | POA: Insufficient documentation

## 2012-10-27 DIAGNOSIS — E119 Type 2 diabetes mellitus without complications: Secondary | ICD-10-CM | POA: Insufficient documentation

## 2012-10-27 DIAGNOSIS — N289 Disorder of kidney and ureter, unspecified: Secondary | ICD-10-CM | POA: Insufficient documentation

## 2012-10-27 HISTORY — DX: Hyperlipidemia, unspecified: E78.5

## 2012-10-27 HISTORY — DX: Calculus of kidney: N20.0

## 2012-10-27 HISTORY — DX: Type 2 diabetes mellitus without complications: E11.9

## 2012-10-27 HISTORY — DX: Benign prostatic hyperplasia without lower urinary tract symptoms: N40.0

## 2012-10-27 LAB — CBC WITH DIFFERENTIAL/PLATELET
Eosinophils Absolute: 0.1 10*3/uL (ref 0.0–0.7)
HCT: 40.5 % (ref 39.0–52.0)
Hemoglobin: 13.5 g/dL (ref 13.0–17.0)
Lymphocytes Relative: 18 % (ref 12–46)
MCH: 30.9 pg (ref 26.0–34.0)
MCHC: 33.3 g/dL (ref 30.0–36.0)
RBC: 4.37 MIL/uL (ref 4.22–5.81)
WBC: 5.4 10*3/uL (ref 4.0–10.5)

## 2012-10-27 LAB — URINALYSIS, ROUTINE W REFLEX MICROSCOPIC
Bilirubin Urine: NEGATIVE
Nitrite: NEGATIVE
Specific Gravity, Urine: 1.02 (ref 1.005–1.030)
pH: 6.5 (ref 5.0–8.0)

## 2012-10-27 LAB — COMPREHENSIVE METABOLIC PANEL
ALT: 11 U/L (ref 0–53)
AST: 17 U/L (ref 0–37)
Alkaline Phosphatase: 69 U/L (ref 39–117)
Calcium: 9.9 mg/dL (ref 8.4–10.5)
Potassium: 4.7 mEq/L (ref 3.5–5.1)
Sodium: 135 mEq/L (ref 135–145)
Total Protein: 7 g/dL (ref 6.0–8.3)

## 2012-10-27 LAB — URINE MICROSCOPIC-ADD ON

## 2012-10-27 LAB — LIPASE, BLOOD: Lipase: 25 U/L (ref 11–59)

## 2012-10-27 MED ORDER — IOHEXOL 300 MG/ML  SOLN
50.0000 mL | Freq: Once | INTRAMUSCULAR | Status: AC | PRN
  Administered 2012-10-27: 50 mL via ORAL

## 2012-10-27 MED ORDER — SODIUM CHLORIDE 0.9 % IV SOLN
INTRAVENOUS | Status: DC
  Administered 2012-10-27: 16:00:00 via INTRAVENOUS

## 2012-10-27 NOTE — ED Notes (Signed)
Patient assisted with urinal tolerated well. Output of 250cc

## 2012-10-27 NOTE — ED Notes (Signed)
Patient states he does not need anything at this time. Patient is just waiting on results from the MD.

## 2012-10-27 NOTE — ED Notes (Signed)
Low abd pain for 2 days,.  NO NVD,NO fever or chills,

## 2012-10-27 NOTE — ED Notes (Signed)
Pt c/o lower abdominal pain since last night. Pt denies NVD, chest pain, SOB. Pt ambulatory with walker on arrival.

## 2012-10-27 NOTE — ED Provider Notes (Signed)
History     CSN: [id number]  Arrival date & time 10/27/12  1106   First MD Initiated Contact with Patient 10/27/12 1435      Chief Complaint  Patient presents with  . Abdominal Pain     HPI Pt was seen at 1500.   Per pt, c/o gradual onset and persistence of constant generalized lower abd "pain" since last night.  Pt states he has had this pain previously, but cannot recall what he was dx with. Denies N/V/D, no CP/SOB, no back/flank pain, no dysuria/hematuria, no testicular pain/swelling.    Past Medical History  Diagnosis Date  . Hypertension   . Diabetes mellitus without complication   . Kidney stones   . BPH (benign prostatic hypertrophy)   . Hyperlipidemia     Past Surgical History  Procedure Laterality Date  . Surgery for kidney stones    . Transurethral resection of prostate    . Total knee arthroplasty Left   . Coronary artery bypass graft      History  Substance Use Topics  . Smoking status: Never Smoker   . Smokeless tobacco: Not on file  . Alcohol Use: No    Review of Systems ROS: Statement: All systems negative except as marked or noted in the HPI; Constitutional: Negative for fever and chills. ; ; Eyes: Negative for eye pain, redness and discharge. ; ; ENMT: Negative for ear pain, hoarseness, nasal congestion, sinus pressure and sore throat. ; ; Cardiovascular: Negative for chest pain, palpitations, diaphoresis, dyspnea and peripheral edema. ; ; Respiratory: Negative for cough, wheezing and stridor. ; ; Gastrointestinal: +abd pain. Negative for nausea, vomiting, diarrhea, blood in stool, hematemesis, jaundice and rectal bleeding. . ; ; Genitourinary: Negative for dysuria, flank pain and hematuria. ; ; Genital:  No penile drainage or rash, no testicular pain or swelling, no scrotal rash or swelling.;; Musculoskeletal: Negative for back pain and neck pain. Negative for swelling and trauma.; ; Skin: Negative for pruritus, rash, abrasions, blisters, bruising and skin  lesion.; ; Neuro: Negative for headache, lightheadedness and neck stiffness. Negative for weakness, altered level of consciousness , altered mental status, extremity weakness, paresthesias, involuntary movement, seizure and syncope.      Allergies  Iohexol  Home Medications   Current Outpatient Rx  Name  Route  Sig  Dispense  Refill  . aspirin EC 81 MG tablet   Oral   Take 81 mg by mouth daily.         . hydrochlorothiazide (MICROZIDE) 12.5 MG capsule   Oral   Take 12.5 mg by mouth daily.         . Hyprom-Naphaz-Polysorb-Zn Sulf (CLEAR EYES COMPLETE OP)   Ophthalmic   Apply 1 drop to eye 2 (two) times daily as needed. Allergy relief.         . levothyroxine (SYNTHROID, LEVOTHROID) 50 MCG tablet   Oral   Take 50 mcg by mouth daily.         . pioglitazone (ACTOS) 30 MG tablet   Oral   Take 30 mg by mouth daily.         . rosuvastatin (CRESTOR) 20 MG tablet   Oral   Take 20 mg by mouth daily.         . valsartan-hydrochlorothiazide (DIOVAN-HCT) 80-12.5 MG per tablet   Oral   Take 1 tablet by mouth daily.           BP 118/95  Pulse 88  Temp(Src) 97.9 F (36.6 C) (  Oral)  Resp 20  Ht 5\' 6"  (1.676 m)  Wt 160 lb (72.576 kg)  BMI 25.84 kg/m2  SpO2 99%  Physical Exam 1505: Physical examination:  Nursing notes reviewed; Vital signs and O2 SAT reviewed;  Constitutional: Well developed, Well nourished, Well hydrated, In no acute distress; Head:  Normocephalic, atraumatic; Eyes: EOMI, PERRL, No scleral icterus; ENMT: Mouth and pharynx normal, Mucous membranes moist; Neck: Supple, Full range of motion, No lymphadenopathy; Cardiovascular: Regular rate and rhythm, No gallop; Respiratory: Breath sounds clear & equal bilaterally, No rales, rhonchi, wheezes.  Speaking full sentences with ease, Normal respiratory effort/excursion; Chest: Nontender, Movement normal; Abdomen: Soft, +mild suprapubic tenderness to palp. No rebound or guarding. Nondistended, Normal bowel  sounds; Genitourinary: No CVA tenderness; Extremities: Pulses normal, No tenderness, No edema, No calf edema or asymmetry.; Neuro: AA&Ox3, +HOH, otherwise major CN grossly intact.  Speech clear. No gross focal motor or sensory deficits in extremities.; Skin: Color normal, Warm, Dry.   ED Course  Procedures   1700:  Bladder scan with 168ml+ of urine in bladder after my exam.  NT then assisted pt with urinal where he voided approx spontaneously. Pt told ED staff he "feels better."  1900:   BUN/Cr improved from baseline.  CT scan with right inguinal hernia dating back to 2011.  Pt denies his abd pain was at the hernia site.  Pt appears NAD, VSS, resps easy. Abd re-examined:  abd soft, NT, +right inguinal hernia soft, NT to palp, no overlying abd wall erythema/ecchymosis, flattens and reduces when pt lies supine with knees bent, no scrotal swelling/pain/erythema.  Has ambulated with his walker with steady gait, easy resps.  Pt states he continues to "feel better now" and wants to go home.  Dx and testing d/w pt and family.  Questions answered.  Verb understanding, agreeable to d/c home with outpt f/u.   MDM  MDM Reviewed: previous chart, nursing note and vitals Reviewed previous: labs Interpretation: labs and CT scan   Results for orders placed during the hospital encounter of 10/27/12  CBC WITH DIFFERENTIAL      Result Value Range   WBC 5.4  4.0 - 10.5 K/uL   RBC 4.37  4.22 - 5.81 MIL/uL   Hemoglobin 13.5  13.0 - 17.0 g/dL   HCT 16.1  09.6 - 04.5 %   MCV 92.7  78.0 - 100.0 fL   MCH 30.9  26.0 - 34.0 pg   MCHC 33.3  30.0 - 36.0 g/dL   RDW 40.9  81.1 - 91.4 %   Platelets 140 (*) 150 - 400 K/uL   Neutrophils Relative 73  43 - 77 %   Neutro Abs 3.9  1.7 - 7.7 K/uL   Lymphocytes Relative 18  12 - 46 %   Lymphs Abs 1.0  0.7 - 4.0 K/uL   Monocytes Relative 7  3 - 12 %   Monocytes Absolute 0.4  0.1 - 1.0 K/uL   Eosinophils Relative 3  0 - 5 %   Eosinophils Absolute 0.1  0.0 - 0.7 K/uL    Basophils Relative 0  0 - 1 %   Basophils Absolute 0.0  0.0 - 0.1 K/uL  COMPREHENSIVE METABOLIC PANEL      Result Value Range   Sodium 135  135 - 145 mEq/L   Potassium 4.7  3.5 - 5.1 mEq/L   Chloride 99  96 - 112 mEq/L   CO2 27  19 - 32 mEq/L   Glucose, Bld 252 (*)  70 - 99 mg/dL   BUN 35 (*) 6 - 23 mg/dL   Creatinine, Ser 1.61 (*) 0.50 - 1.35 mg/dL   Calcium 9.9  8.4 - 09.6 mg/dL   Total Protein 7.0  6.0 - 8.3 g/dL   Albumin 3.7  3.5 - 5.2 g/dL   AST 17  0 - 37 U/L   ALT 11  0 - 53 U/L   Alkaline Phosphatase 69  39 - 117 U/L   Total Bilirubin 0.8  0.3 - 1.2 mg/dL   GFR calc non Af Amer 35 (*) >90 mL/min   GFR calc Af Amer 41 (*) >90 mL/min  CBC WITH DIFFERENTIAL      Result Value Range   WBC DUPLICATE REQUEST     RBC DUPLICATE REQUEST     Hemoglobin DUPLICATE REQUEST     HCT DUPLICATE REQUEST     MCV DUPLICATE REQUEST     MCH DUPLICATE REQUEST     MCHC DUPLICATE REQUEST     RDW DUPLICATE REQUEST     Platelets DUPLICATE REQUEST     Neutrophils Relative PENDING     Neutro Abs PENDING     Band Neutrophils PENDING     Lymphocytes Relative PENDING     Lymphs Abs PENDING     Monocytes Relative PENDING     Monocytes Absolute PENDING     Eosinophils Relative PENDING     Eosinophils Absolute PENDING     Basophils Relative PENDING     Basophils Absolute PENDING     LUCs, % PENDING     LUC, Absolute PENDING     WBC Morphology PENDING     RBC Morphology PENDING     Smear Review PENDING     Other PENDING     Other 2 PENDING     nRBC PENDING     Metamyelocytes Relative PENDING     Myelocytes PENDING     Promyelocytes Absolute PENDING     Blasts PENDING    LIPASE, BLOOD      Result Value Range   Lipase 25  11 - 59 U/L  URINALYSIS, ROUTINE W REFLEX MICROSCOPIC      Result Value Range   Color, Urine YELLOW  YELLOW   APPearance CLEAR  CLEAR   Specific Gravity, Urine 1.020  1.005 - 1.030   pH 6.5  5.0 - 8.0   Glucose, UA 500 (*) NEGATIVE mg/dL   Hgb urine dipstick  TRACE (*) NEGATIVE   Bilirubin Urine NEGATIVE  NEGATIVE   Ketones, ur NEGATIVE  NEGATIVE mg/dL   Protein, ur 30 (*) NEGATIVE mg/dL   Urobilinogen, UA 0.2  0.0 - 1.0 mg/dL   Nitrite NEGATIVE  NEGATIVE   Leukocytes, UA SMALL (*) NEGATIVE  URINE MICROSCOPIC-ADD ON      Result Value Range   Squamous Epithelial / LPF RARE  RARE   WBC, UA 3-6  <3 WBC/hpf   RBC / HPF 7-10  <3 RBC/hpf   Bacteria, UA FEW (*) RARE   Ct Abdomen Pelvis Wo Contrast 10/27/2012  *RADIOLOGY REPORT*  Clinical Data: Low abdominal pain  CT ABDOMEN AND PELVIS WITHOUT CONTRAST  Technique:  Multidetector CT imaging of the abdomen and pelvis was performed following the standard protocol without intravenous contrast.  Comparison: CT 07/05/2010  Findings: Lung bases are clear.  No pericardial fluid.  Non-IV contrast images demonstrate no focal hepatic lesion.  The gallbladder, pancreas, spleen, adrenal glands, kidneys are unchanged from prior.  There is bilateral renal cortical  thinning. There is nonobstructing calculi within the right kidney.  No hydronephrosis.  There is a moderate size hiatal hernia.  The stomach, small bowel and cecum are normal.  There are diverticula of the sigmoid colon without acute inflammation.  There is a left lower quadrant to note that there is a right inguinal hernia which contains a long segment nonobstructed small cold bowel.  Abdominal aorta normal caliber heavily calcified.  No retroperitoneal periportal lymphadenopathy.  No free fluid the pelvis.  No pelvic lymphadenopathy.  Prostate gland and bladder are normal. Review of  bone windows demonstrates no aggressive osseous lesions.  IMPRESSION:  1.  No acute abdominal or pelvic findings. 2.  Sigmoid diverticulosis without evidence of acute diverticulitis. 3.  No evidence of ureterolithiasis or obstructive uropathy. 4.  Right inguinal hernia contains a long segment nonobstructed small bowel.  This is worsened compared to prior exam.   Original Report  Authenticated By: Genevive Bi, M.D.     Results for MESSIYAH, WATERSON (MRN [id number]) as of 10/27/2012 18:37  Ref. Range 07/05/2010 15:50 07/14/2010 14:01 10/27/2012 13:41  BUN Latest Range: 6-23 mg/dL 32 (H) 40 (H) 35 (H)  Creatinine Latest Range: 0.50-1.35 mg/dL 9.56 (H) 2.13 (H) 0.86 (H)         Laray Anger, DO 10/30/12 1744

## 2012-10-29 LAB — URINE CULTURE

## 2013-03-06 ENCOUNTER — Other Ambulatory Visit (HOSPITAL_COMMUNITY): Payer: Self-pay | Admitting: Ophthalmology

## 2013-03-06 DIAGNOSIS — G839 Paralytic syndrome, unspecified: Secondary | ICD-10-CM

## 2013-03-08 ENCOUNTER — Ambulatory Visit (HOSPITAL_COMMUNITY)
Admission: RE | Admit: 2013-03-08 | Discharge: 2013-03-08 | Disposition: A | Discharge status: Home / Self Care | Payer: Medicare Other | Source: Ambulatory Visit | Attending: Ophthalmology | Admitting: Ophthalmology

## 2013-03-08 DIAGNOSIS — I6350 Cerebral infarction due to unspecified occlusion or stenosis of unspecified cerebral artery: Secondary | ICD-10-CM | POA: Insufficient documentation

## 2013-03-08 DIAGNOSIS — G839 Paralytic syndrome, unspecified: Secondary | ICD-10-CM

## 2013-03-08 DIAGNOSIS — H532 Diplopia: Secondary | ICD-10-CM | POA: Insufficient documentation

## 2013-03-27 ENCOUNTER — Ambulatory Visit: Payer: Medicare Other | Admitting: Neurology

## 2014-05-04 ENCOUNTER — Encounter (HOSPITAL_COMMUNITY): Payer: Self-pay | Admitting: Emergency Medicine

## 2014-05-04 ENCOUNTER — Emergency Department (HOSPITAL_COMMUNITY)
Admission: EM | Admit: 2014-05-04 | Discharge: 2014-05-04 | Disposition: A | Discharge status: Home / Self Care | Payer: Medicare Other | Attending: Emergency Medicine | Admitting: Emergency Medicine

## 2014-05-04 DIAGNOSIS — N289 Disorder of kidney and ureter, unspecified: Secondary | ICD-10-CM | POA: Diagnosis not present

## 2014-05-04 DIAGNOSIS — Z951 Presence of aortocoronary bypass graft: Secondary | ICD-10-CM | POA: Insufficient documentation

## 2014-05-04 DIAGNOSIS — E785 Hyperlipidemia, unspecified: Secondary | ICD-10-CM | POA: Insufficient documentation

## 2014-05-04 DIAGNOSIS — Z7982 Long term (current) use of aspirin: Secondary | ICD-10-CM | POA: Diagnosis not present

## 2014-05-04 DIAGNOSIS — Z792 Long term (current) use of antibiotics: Secondary | ICD-10-CM | POA: Insufficient documentation

## 2014-05-04 DIAGNOSIS — Z79899 Other long term (current) drug therapy: Secondary | ICD-10-CM | POA: Diagnosis not present

## 2014-05-04 DIAGNOSIS — E119 Type 2 diabetes mellitus without complications: Secondary | ICD-10-CM | POA: Diagnosis not present

## 2014-05-04 DIAGNOSIS — I2510 Atherosclerotic heart disease of native coronary artery without angina pectoris: Secondary | ICD-10-CM | POA: Diagnosis not present

## 2014-05-04 DIAGNOSIS — Z87442 Personal history of urinary calculi: Secondary | ICD-10-CM | POA: Insufficient documentation

## 2014-05-04 DIAGNOSIS — Z9889 Other specified postprocedural states: Secondary | ICD-10-CM | POA: Insufficient documentation

## 2014-05-04 DIAGNOSIS — K469 Unspecified abdominal hernia without obstruction or gangrene: Secondary | ICD-10-CM | POA: Diagnosis present

## 2014-05-04 DIAGNOSIS — K4090 Unilateral inguinal hernia, without obstruction or gangrene, not specified as recurrent: Secondary | ICD-10-CM | POA: Insufficient documentation

## 2014-05-04 HISTORY — DX: Atherosclerotic heart disease of native coronary artery without angina pectoris: I25.10

## 2014-05-04 LAB — BASIC METABOLIC PANEL
Anion gap: 18 — ABNORMAL HIGH (ref 5–15)
BUN: 50 mg/dL — AB (ref 6–23)
CALCIUM: 9.7 mg/dL (ref 8.4–10.5)
CO2: 24 mEq/L (ref 19–32)
CREATININE: 2.4 mg/dL — AB (ref 0.50–1.35)
Chloride: 94 mEq/L — ABNORMAL LOW (ref 96–112)
GFR, EST AFRICAN AMERICAN: 26 mL/min — AB (ref >90)
GFR, EST NON AFRICAN AMERICAN: 22 mL/min — AB (ref >90)
GLUCOSE: 133 mg/dL — AB (ref 70–99)
POTASSIUM: 3.7 meq/L (ref 3.7–5.3)
Sodium: 136 mEq/L — ABNORMAL LOW (ref 137–147)

## 2014-05-04 LAB — CBC WITH DIFFERENTIAL/PLATELET
BASOS PCT: 0 % (ref 0–1)
Basophils Absolute: 0 10*3/uL (ref 0.0–0.1)
EOS ABS: 0.3 10*3/uL (ref 0.0–0.7)
EOS PCT: 4 % (ref 0–5)
HCT: 38.8 % — ABNORMAL LOW (ref 39.0–52.0)
Hemoglobin: 13.1 g/dL (ref 13.0–17.0)
LYMPHS ABS: 1.3 10*3/uL (ref 0.7–4.0)
Lymphocytes Relative: 20 % (ref 12–46)
MCH: 30.1 pg (ref 26.0–34.0)
MCHC: 33.8 g/dL (ref 30.0–36.0)
MCV: 89.2 fL (ref 78.0–100.0)
Monocytes Absolute: 0.7 10*3/uL (ref 0.1–1.0)
Monocytes Relative: 10 % (ref 3–12)
NEUTROS PCT: 66 % (ref 43–77)
Neutro Abs: 4.2 10*3/uL (ref 1.7–7.7)
PLATELETS: 173 10*3/uL (ref 150–400)
RBC: 4.35 MIL/uL (ref 4.22–5.81)
RDW: 14.4 % (ref 11.5–15.5)
WBC: 6.4 10*3/uL (ref 4.0–10.5)

## 2014-05-04 NOTE — ED Notes (Signed)
Pt assisted to bathroom and back to room; pt sitting up in chair

## 2014-05-04 NOTE — Discharge Instructions (Signed)
Hernia A hernia happens when an organ inside your body pushes out through a weak spot in your belly (abdominal) wall. Most hernias get worse over time. They can often be pushed back into place (reduced). Surgery may be needed to repair hernias that cannot be pushed into place. HOME CARE  Keep doing normal activities.  Avoid lifting more than 10 pounds (4.5 kilograms).  Cough gently and avoid straining. Over time, these things will:  Increase your hernia size.  Irritate your hernia.  Break down hernia repairs.  Stop smoking.  Do not wear anything tight over your hernia. Do not keep the hernia in with an outside bandage.  Eat food that is high in fiber (fruit, vegetables, whole grains).  Drink enough fluids to keep your pee (urine) clear or pale yellow.  Take medicines to make your poop soft (stool softeners) if you cannot poop (constipated). GET HELP RIGHT AWAY IF:   You have a fever.  You have belly pain that gets worse.  You feel sick to your stomach (nauseous) and throw up (vomit).  Your skin starts to bulge out.  Your hernia turns a different color, feels hard, or is tender.  You have increased pain or puffiness (swelling) around the hernia.  You poop more or less often.  Your poop does not look the way normally does.  You have watery poop (diarrhea).  You cannot push the hernia back in place by applying gentle pressure while lying down. MAKE SURE YOU:   Understand these instructions.  Will watch your condition.  Will get help right away if you are not doing well or get worse. Document Released: 02/04/2010 Document Revised: 11/09/2011 Document Reviewed: 02/04/2010 [facility] Patient Information 2015 [place], [place]. This information is not intended to replace advice given to you by your health care provider. Make sure you discuss any questions you have with your health care provider.  Patient with large right inguinal hernia easily reducible. Nontender no  signs of strangulation. Followup with general surgery for evaluation. Followup with your regular Dr. for evaluation of your kidney insufficiency which has been present for a few years. Need to make sure it does not get worse.  Return for fevers significant abdominal pain or persistent vomiting.

## 2014-05-04 NOTE — ED Notes (Signed)
Son reports patient has  Hx of a hernia being watched by patient's PMD.  States R lower quadrant hernia has gotten hard, but is not painful.

## 2014-05-04 NOTE — ED Provider Notes (Addendum)
CSN: [id number]     Arrival date & time 05/04/14  1850 History  This chart was scribed for Vanetta Mulders, MD by Luisa Dago, ED Scribe. This patient was seen in room APA05/APA05 and the patient's care was started at 9:52 PM.    Chief Complaint  Patient presents with  . Hernia   The history is provided by the patient. No language interpreter was used.   (Level 5 Caveat--pt is hard of hearing; unable to answer questions)  HPI Comments: James Pruitt is a 80 y.o. male who presents to the Emergency Department with son complaining of hernia to the RLQ for the past 10 months, however today he is complaining of its enlargement and hardening. Care giver also states that the pt was complaining of associated "burning" sensation. He is also endorses of a fever. Current ED temperature is 98. Son endorses associated emesis and nausea, but did not physically see this happen.  Acccording to past medical history pt is allergic to CT contrast.  Past Medical History  Diagnosis Date  . Diabetes mellitus without complication   . Kidney stones   . BPH (benign prostatic hypertrophy)   . Hyperlipidemia   . Coronary artery disease    Past Surgical History  Procedure Laterality Date  . Surgery for kidney stones    . Transurethral resection of prostate    . Total knee arthroplasty Left   . Coronary artery bypass graft     History reviewed. No pertinent family history. History  Substance Use Topics  . Smoking status: Never Smoker   . Smokeless tobacco: Not on file  . Alcohol Use: No    Review of Systems  Unable to perform ROS: Other   Allergies  Iohexol  Home Medications   Prior to Admission medications   Medication Sig Start Date End Date Taking? Authorizing Provider  guaiFENesin (MUCINEX) 600 MG 12 hr tablet Take 600 mg by mouth 2 (two) times daily.   Yes Historical Provider, MD  levofloxacin (LEVAQUIN) 500 MG tablet Take 500 mg by mouth daily. 04/26/14  Yes Historical Provider, MD   aspirin EC 81 MG tablet Take 81 mg by mouth daily.    Historical Provider, MD  hydrochlorothiazide (MICROZIDE) 12.5 MG capsule Take 12.5 mg by mouth daily.    Historical Provider, MD  Hyprom-Naphaz-Polysorb-Zn Sulf (CLEAR EYES COMPLETE OP) Apply 1 drop to eye 2 (two) times daily as needed. Allergy relief.    Historical Provider, MD  levothyroxine (SYNTHROID, LEVOTHROID) 50 MCG tablet Take 50 mcg by mouth daily.    Historical Provider, MD  metFORMIN (GLUCOPHAGE) 500 MG tablet Take 500 mg by mouth 2 (two) times daily. 05/03/14   Historical Provider, MD  pioglitazone (ACTOS) 30 MG tablet Take 30 mg by mouth daily.    Historical Provider, MD  rosuvastatin (CRESTOR) 20 MG tablet Take 20 mg by mouth at bedtime.     Historical Provider, MD  valsartan-hydrochlorothiazide (DIOVAN-HCT) 80-12.5 MG per tablet Take 1 tablet by mouth daily.    Historical Provider, MD   Triage Vitals: BP 95/58  Temp(Src) 98 F (36.7 C) (Oral)  Resp 18  Ht  (1.702 m)  Wt 165 lb (74.844 kg)  BMI 25.84 kg/m2  SpO2 99%  Physical Exam  Nursing note and vitals reviewed. Constitutional: He is oriented to person, place, and time. He appears well-developed and well-nourished. No distress.  HENT:  Head: Normocephalic and atraumatic.  Eyes: Conjunctivae and EOM are normal.  Neck: Neck supple.  Cardiovascular: Normal rate,  regular rhythm and normal heart sounds.  Exam reveals no gallop and no friction rub.   No murmur heard. Pulmonary/Chest: Effort normal and breath sounds normal. No respiratory distress. He has no wheezes. He has no rales. He exhibits no tenderness.  Abdominal: Soft. Bowel sounds are normal. He exhibits no distension. There is no tenderness. A hernia is present. Hernia confirmed positive in the right inguinal area.  Genitourinary: Testes normal and penis normal. Right testis is descended. Left testis is descended. Circumcised.  There is a right groin mass. Inguinal her with large opening approximately 3 cm  in size. Mass easily reducible and did not extend down into the scrotum. Nontender.  Musculoskeletal: Normal range of motion.  No ankle edema bilaterally.  Neurological: He is alert and oriented to person, place, and time. No cranial nerve deficit. He exhibits normal muscle tone. Coordination normal.  Skin: Skin is warm and dry.  Psychiatric: He has a normal mood and affect. His behavior is normal.    ED Course  Procedures (including critical care time)  Medications - No data to display   Results for orders placed during the hospital encounter of 05/04/14  BASIC METABOLIC PANEL      Result Value Ref Range   Sodium 136 (*) 137 - 147 mEq/L   Potassium 3.7  3.7 - 5.3 mEq/L   Chloride 94 (*) 96 - 112 mEq/L   CO2 24  19 - 32 mEq/L   Glucose, Bld 133 (*) 70 - 99 mg/dL   BUN 50 (*) 6 - 23 mg/dL   Creatinine, Ser 9.56 (*) 0.50 - 1.35 mg/dL   Calcium 9.7  8.4 - 21.3 mg/dL   GFR calc non Af Amer 22 (*) >90 mL/min   GFR calc Af Amer 26 (*) >90 mL/min   Anion gap 18 (*) 5 - 15  CBC WITH DIFFERENTIAL      Result Value Ref Range   WBC 6.4  4.0 - 10.5 K/uL   RBC 4.35  4.22 - 5.81 MIL/uL   Hemoglobin 13.1  13.0 - 17.0 g/dL   HCT 08.6 (*) 57.8 - 46.9 %   MCV 89.2  78.0 - 100.0 fL   MCH 30.1  26.0 - 34.0 pg   MCHC 33.8  30.0 - 36.0 g/dL   RDW 62.9  52.8 - 41.3 %   Platelets 173  150 - 400 K/uL   Neutrophils Relative % 66  43 - 77 %   Neutro Abs 4.2  1.7 - 7.7 K/uL   Lymphocytes Relative 20  12 - 46 %   Lymphs Abs 1.3  0.7 - 4.0 K/uL   Monocytes Relative 10  3 - 12 %   Monocytes Absolute 0.7  0.1 - 1.0 K/uL   Eosinophils Relative 4  0 - 5 %   Eosinophils Absolute 0.3  0.0 - 0.7 K/uL   Basophils Relative 0  0 - 1 %   Basophils Absolute 0.0  0.0 - 0.1 K/uL   No results found.  10:11 PM- Pt advised of plan for treatment and pt agrees.    MDM   Final diagnoses:  Hernia, inguinal, right  Renal insufficiency     Patient with a known right groin hernia for over a year has been  seen by general surgery in the past. Elected not to have it repaired. Patient's son noted that the bulge seems to be bigger. There was some concern because there was a reported history of some nausea and vomiting none  witnessed. And decreased appetite. Patient has a history of diabetes and based on previous renal function test has renal insufficiency. Electrolytes and creatinine and BUN not significantly worse than they have been in the past. Patient's hernia is easily reducible is a large right groin inguinal hernia no evidence of strangulation. Followup will general surgery is recommended for reevaluation. In addition close followup by the patient's primary care doctor to make sure the renal function does not get worse.  Patient here is nontoxic no acute distress no significant abdominal tenderness. No vomiting.  I personally performed the services described in this documentation, which was scribed in my presence. The recorded information has been reviewed and is accurate.    Vanetta Mulders, MD 05/04/14 0454  Vanetta Mulders, MD 05/04/14 [phone]

## 2015-10-26 ENCOUNTER — Emergency Department (HOSPITAL_COMMUNITY): Payer: Medicare Other

## 2015-10-26 ENCOUNTER — Encounter (HOSPITAL_COMMUNITY): Payer: Self-pay

## 2015-10-26 ENCOUNTER — Emergency Department (HOSPITAL_COMMUNITY)
Admission: EM | Admit: 2015-10-26 | Discharge: 2015-10-26 | Disposition: A | Discharge status: Home / Self Care | Payer: Medicare Other | Attending: Emergency Medicine | Admitting: Emergency Medicine

## 2015-10-26 DIAGNOSIS — Z7982 Long term (current) use of aspirin: Secondary | ICD-10-CM | POA: Insufficient documentation

## 2015-10-26 DIAGNOSIS — Z794 Long term (current) use of insulin: Secondary | ICD-10-CM | POA: Diagnosis not present

## 2015-10-26 DIAGNOSIS — Z79899 Other long term (current) drug therapy: Secondary | ICD-10-CM | POA: Diagnosis not present

## 2015-10-26 DIAGNOSIS — Z792 Long term (current) use of antibiotics: Secondary | ICD-10-CM | POA: Insufficient documentation

## 2015-10-26 DIAGNOSIS — Y92128 Other place in nursing home as the place of occurrence of the external cause: Secondary | ICD-10-CM | POA: Insufficient documentation

## 2015-10-26 DIAGNOSIS — Z043 Encounter for examination and observation following other accident: Secondary | ICD-10-CM | POA: Insufficient documentation

## 2015-10-26 DIAGNOSIS — E079 Disorder of thyroid, unspecified: Secondary | ICD-10-CM | POA: Diagnosis not present

## 2015-10-26 DIAGNOSIS — H9190 Unspecified hearing loss, unspecified ear: Secondary | ICD-10-CM | POA: Insufficient documentation

## 2015-10-26 DIAGNOSIS — E785 Hyperlipidemia, unspecified: Secondary | ICD-10-CM | POA: Diagnosis not present

## 2015-10-26 DIAGNOSIS — W19XXXA Unspecified fall, initial encounter: Secondary | ICD-10-CM

## 2015-10-26 DIAGNOSIS — Z87442 Personal history of urinary calculi: Secondary | ICD-10-CM | POA: Diagnosis not present

## 2015-10-26 DIAGNOSIS — Z7984 Long term (current) use of oral hypoglycemic drugs: Secondary | ICD-10-CM | POA: Insufficient documentation

## 2015-10-26 DIAGNOSIS — Z87448 Personal history of other diseases of urinary system: Secondary | ICD-10-CM | POA: Insufficient documentation

## 2015-10-26 DIAGNOSIS — E1165 Type 2 diabetes mellitus with hyperglycemia: Secondary | ICD-10-CM | POA: Diagnosis not present

## 2015-10-26 DIAGNOSIS — I2510 Atherosclerotic heart disease of native coronary artery without angina pectoris: Secondary | ICD-10-CM | POA: Insufficient documentation

## 2015-10-26 DIAGNOSIS — M1990 Unspecified osteoarthritis, unspecified site: Secondary | ICD-10-CM | POA: Diagnosis not present

## 2015-10-26 DIAGNOSIS — Y92129 Unspecified place in nursing home as the place of occurrence of the external cause: Secondary | ICD-10-CM

## 2015-10-26 DIAGNOSIS — W01198A Fall on same level from slipping, tripping and stumbling with subsequent striking against other object, initial encounter: Secondary | ICD-10-CM | POA: Insufficient documentation

## 2015-10-26 DIAGNOSIS — F0151 Vascular dementia with behavioral disturbance: Secondary | ICD-10-CM | POA: Diagnosis not present

## 2015-10-26 DIAGNOSIS — R739 Hyperglycemia, unspecified: Secondary | ICD-10-CM

## 2015-10-26 DIAGNOSIS — Y998 Other external cause status: Secondary | ICD-10-CM | POA: Insufficient documentation

## 2015-10-26 DIAGNOSIS — Y9389 Activity, other specified: Secondary | ICD-10-CM | POA: Diagnosis not present

## 2015-10-26 HISTORY — DX: Vascular dementia without behavioral disturbance: F01.50

## 2015-10-26 HISTORY — DX: Muscle weakness (generalized): M62.81

## 2015-10-26 HISTORY — DX: Dysphagia, oral phase: R13.11

## 2015-10-26 HISTORY — DX: Major depressive disorder, single episode, unspecified: F32.9

## 2015-10-26 HISTORY — DX: Hyperlipidemia, unspecified: E78.5

## 2015-10-26 HISTORY — DX: Disorder of thyroid, unspecified: E07.9

## 2015-10-26 HISTORY — DX: Vascular dementia with behavioral disturbance: F01.51

## 2015-10-26 HISTORY — DX: Unspecified dementia with behavioral disturbance: F03.91

## 2015-10-26 HISTORY — DX: Cognitive communication deficit: R41.841

## 2015-10-26 HISTORY — DX: Unspecified osteoarthritis, unspecified site: M19.90

## 2015-10-26 HISTORY — DX: Unsteadiness on feet: R26.81

## 2015-10-26 HISTORY — DX: Depression, unspecified: F32.A

## 2015-10-26 HISTORY — DX: Weakness: R53.1

## 2015-10-26 HISTORY — DX: Atherosclerotic heart disease of native coronary artery without angina pectoris: I25.10

## 2015-10-26 HISTORY — DX: Unspecified hearing loss, unspecified ear: H91.90

## 2015-10-26 HISTORY — DX: History of falling: Z91.81

## 2015-10-26 HISTORY — DX: Unspecified dementia, unspecified severity, with other behavioral disturbance: F03.918

## 2015-10-26 HISTORY — DX: Vascular dementia, unspecified severity, with mood disturbance: F01.53

## 2015-10-26 LAB — CBG MONITORING, ED: Glucose-Capillary: 244 mg/dL — ABNORMAL HIGH (ref 65–99)

## 2015-10-26 NOTE — ED Provider Notes (Signed)
CSN: [id number]     Arrival date & time 10/26/15  0202 History   First MD Initiated Contact with Patient 10/26/15 [phone]    Chief Complaint  Patient presents with  . Fall   Level V caveat for dementia  (Consider location/radiation/quality/duration/timing/severity/associated sxs/prior Treatment) HPI patient's son states he was called this patient had fallen tonight and they thought he hit his head on the door. Patient doesn't remember falling. They reported he had vomiting about 3 times after the fall. They also report his CBG was 330. Patient denies any pain to me. He denies any nausea or vomiting now. He states he feels fine.   PCP Dr Selena Batten  Past Medical History  Diagnosis Date  . Diabetes mellitus without complication (HCC)   . Kidney stones   . BPH (benign prostatic hypertrophy)   . Hyperlipidemia   . Coronary artery disease   . Coronary atherosclerosis of native coronary artery   . Dysphagia, oral phase   . Personal history of fall   . Thyroid disease   . Muscle weakness (generalized)   . Dementia with behavioral problem   . Osteoarthritis   . Weakness generalized   . Cognitive communication deficit   . Hyperlipemia   . Vascular dementia with depressed mood   . Problems with hearing   . Unsteady    Past Surgical History  Procedure Laterality Date  . Surgery for kidney stones    . Transurethral resection of prostate    . Total knee arthroplasty Left   . Coronary artery bypass graft     No family history on file. Social History  Substance Use Topics  . Smoking status: Never Smoker   . Smokeless tobacco: None  . Alcohol Use: No  has been in NH x 6 weeks, was living at home with wife also age 24  Review of Systems  All other systems reviewed and are negative.     Allergies  Iohexol  Home Medications   Prior to Admission medications   Medication Sig Start Date End Date Taking? Authorizing Provider  aspirin EC 81 MG tablet Take 81 mg by mouth daily.   Yes  Historical Provider, MD  atorvastatin (LIPITOR) 40 MG tablet Take 40 mg by mouth daily.   Yes Historical Provider, MD  cholecalciferol (VITAMIN D) 400 units TABS tablet Take 2,000 Units by mouth daily.   Yes Historical Provider, MD  hydrochlorothiazide (MICROZIDE) 12.5 MG capsule Take 12.5 mg by mouth daily.   Yes Historical Provider, MD  insulin aspart (NOVOLOG) 100 UNIT/ML injection Inject into the skin 3 (three) times daily before meals. Sliding scale   Yes Historical Provider, MD  insulin glargine (LANTUS) 100 UNIT/ML injection Inject 10 Units into the skin at bedtime.   Yes Historical Provider, MD  levothyroxine (SYNTHROID, LEVOTHROID) 50 MCG tablet Take 50 mcg by mouth daily.   Yes Historical Provider, MD  LORazepam (ATIVAN) 0.5 MG tablet Take 0.5 mg by mouth every 8 (eight) hours as needed for anxiety.   Yes Historical Provider, MD  pioglitazone (ACTOS) 30 MG tablet Take 15 mg by mouth 2 (two) times daily.    Yes Historical Provider, MD  senna (SENOKOT) 8.6 MG TABS tablet Take 1 tablet by mouth.   Yes Historical Provider, MD  sertraline (ZOLOFT) 25 MG tablet Take 25 mg by mouth daily.   Yes Historical Provider, MD  guaiFENesin (MUCINEX) 600 MG 12 hr tablet Take 600 mg by mouth 2 (two) times daily.    Historical Provider,  MD  Hyprom-Naphaz-Polysorb-Zn Sulf (CLEAR EYES COMPLETE OP) Apply 1 drop to eye 2 (two) times daily as needed. Allergy relief.    Historical Provider, MD  levofloxacin (LEVAQUIN) 500 MG tablet Take 500 mg by mouth daily. 04/26/14   Historical Provider, MD  metFORMIN (GLUCOPHAGE) 500 MG tablet Take 500 mg by mouth 2 (two) times daily. 05/03/14   Historical Provider, MD  rosuvastatin (CRESTOR) 20 MG tablet Take 20 mg by mouth at bedtime.     Historical Provider, MD  valsartan-hydrochlorothiazide (DIOVAN-HCT) 80-12.5 MG per tablet Take 1 tablet by mouth daily.    Historical Provider, MD   BP 122/57 mmHg  Pulse 67  Temp(Src) 98 F (36.7 C) (Oral)  Resp 18  Ht 5\' 7"  (1.702 m)   Wt 165 lb (74.844 kg)  BMI 25.84 kg/m2  SpO2 97%  Vital signs normal   Physical Exam  Constitutional: He appears well-developed and well-nourished.  Non-toxic appearance. He does not appear ill. No distress.  HENT:  Head: Normocephalic and atraumatic.  Right Ear: External ear normal.  Left Ear: External ear normal.  Nose: Nose normal. No mucosal edema or rhinorrhea.  Mouth/Throat: Oropharynx is clear and moist and mucous membranes are normal. No dental abscesses or uvula swelling.  I do not see any obvious bruising to his scalp  Eyes: Conjunctivae and EOM are normal. Pupils are equal, round, and reactive to light.  Neck: Normal range of motion and full passive range of motion without pain. Neck supple.  Cardiovascular: Normal rate, regular rhythm and normal heart sounds.  Exam reveals no gallop and no friction rub.   No murmur heard. Pulmonary/Chest: Effort normal and breath sounds normal. No respiratory distress. He has no wheezes. He has no rhonchi. He has no rales. He exhibits no tenderness and no crepitus.  Abdominal: Soft. Normal appearance and bowel sounds are normal. He exhibits no distension. There is no tenderness. There is no rebound and no guarding.  Musculoskeletal: Normal range of motion. He exhibits no edema or tenderness.  Moves all extremities well. He has no pain on upper or lower extremities with flexion and extension.  Neurological: He is alert. He has normal strength. No cranial nerve deficit.  Patient is cooperative.  Skin: Skin is warm, dry and intact. No rash noted. No erythema. No pallor.  Psychiatric: He has a normal mood and affect. His speech is normal and behavior is normal. His mood appears not anxious.  Nursing note and vitals reviewed.   ED Course  Procedures (including critical care time)  Patient has been sleeping quietly per his son since he's been in the ED. Patient has no complaints of pain. I do not find any obvious signs of trauma to his head. He  had no vomiting in the ED. His CBG improved to 244 without treatment in the ED. At this point patient was felt stable to be discharged back to his facility.   Labs Review Labs Reviewed  CBG MONITORING, ED - Abnormal; Notable for the following:    Glucose-Capillary 244 (*)    All other components within normal limits   Laboratory interpretation all normal except improving hyperglycemia   Imaging Review Ct Head Wo Contrast  10/26/2015  CLINICAL DATA:  Fall striking head against furniture. Left posterior head pain. Nausea and vomiting post fall. EXAM: CT HEAD WITHOUT CONTRAST TECHNIQUE: Contiguous axial images were obtained from the base of the skull through the vertex without intravenous contrast. COMPARISON:  Brain MRI 03/08/2013, head CT 08/19/2009 FINDINGS: No  intracranial hemorrhage, mass effect, or midline shift. Atrophy, unchanged from prior. Extensive chronic small vessel ischemia and encephalomalacia in the right occipital and left frontal lobes. Multifocal lacunar infarcts in both cerebellum and basal ganglia. No hydrocephalus. The basilar cisterns are patent. No evidence of territorial infarct. No intracranial fluid collection. Small metallic left infraorbital body, as seen on MRI. Calvarium is intact. Mastoid air cell opacification as well as mucosal thickening throughout the paranasal sinuses unchanged. IMPRESSION: 1.  No acute intracranial abnormality. 2. Advanced chronic change, stable from priors. Electronically Signed   By: Rubye Oaks M.D.   On: 10/26/2015 02:51   I have personally reviewed and evaluated these images and lab results as part of my medical decision-making.   EKG Interpretation   Date/Time:  Saturday October 26 2015 02:06:36 EST Ventricular Rate:  78 PR Interval:  220 QRS Duration: 109 QT Interval:  393 QTC Calculation: 448 R Axis:   67 Text Interpretation:  Sinus rhythm Prolonged PR interval Minimal ST  elevation, anterior leads Baseline wander in  lead(s) V3 Inferior infarct ,  age undetermined No significant change since last tracing 14 Jul 2010  Confirmed by [facility]  MD-I, Burnette Valenti ([id number]) on 10/26/2015 2:45:23 AM      MDM   Final diagnoses:  Fall at nursing home, initial encounter  Hyperglycemia   Plan discharge  Devoria Albe, MD, Concha Pyo, MD 10/26/15 [phone]

## 2015-10-26 NOTE — Discharge Instructions (Signed)
James Pruitt's CT of his head did not show any acute changes. He didn't have any vomiting in the ED. His blood sugar was 244 without treatment when he left the ED.  Return to the ED for any problems listed on the head injury sheet.

## 2015-10-26 NOTE — ED Notes (Signed)
Fell and hit his head at [facility] around 1am. Hit his head. Vomited 3 times after falling. May have been some blood present in vomitus.

## 2015-10-26 NOTE — ED Notes (Signed)
BLood sugar was 330 per EMS

## 2016-01-02 ENCOUNTER — Ambulatory Visit (HOSPITAL_COMMUNITY): Payer: Medicare Other

## 2016-01-02 ENCOUNTER — Ambulatory Visit (HOSPITAL_COMMUNITY)
Admission: RE | Admit: 2016-01-02 | Discharge: 2016-01-02 | Disposition: A | Discharge status: Home / Self Care | Payer: Medicare Other | Source: Ambulatory Visit | Attending: Internal Medicine | Admitting: Internal Medicine

## 2016-01-02 ENCOUNTER — Other Ambulatory Visit (HOSPITAL_COMMUNITY): Payer: Self-pay | Admitting: Internal Medicine

## 2016-01-02 DIAGNOSIS — I739 Peripheral vascular disease, unspecified: Secondary | ICD-10-CM | POA: Diagnosis not present

## 2016-01-02 DIAGNOSIS — S0990XA Unspecified injury of head, initial encounter: Secondary | ICD-10-CM

## 2016-01-02 DIAGNOSIS — W19XXXA Unspecified fall, initial encounter: Secondary | ICD-10-CM | POA: Diagnosis not present

## 2016-01-02 DIAGNOSIS — G319 Degenerative disease of nervous system, unspecified: Secondary | ICD-10-CM | POA: Insufficient documentation

## 2016-01-02 DIAGNOSIS — Z8673 Personal history of transient ischemic attack (TIA), and cerebral infarction without residual deficits: Secondary | ICD-10-CM | POA: Diagnosis not present

## 2016-04-09 ENCOUNTER — Emergency Department (HOSPITAL_COMMUNITY)
Admission: EM | Admit: 2016-04-09 | Discharge: 2016-04-09 | Disposition: A | Discharge status: Home / Self Care | Payer: Medicare Other | Attending: Emergency Medicine | Admitting: Emergency Medicine

## 2016-04-09 ENCOUNTER — Encounter (HOSPITAL_COMMUNITY): Payer: Self-pay

## 2016-04-09 DIAGNOSIS — I252 Old myocardial infarction: Secondary | ICD-10-CM | POA: Diagnosis not present

## 2016-04-09 DIAGNOSIS — I2510 Atherosclerotic heart disease of native coronary artery without angina pectoris: Secondary | ICD-10-CM | POA: Diagnosis not present

## 2016-04-09 DIAGNOSIS — E162 Hypoglycemia, unspecified: Secondary | ICD-10-CM

## 2016-04-09 DIAGNOSIS — E11649 Type 2 diabetes mellitus with hypoglycemia without coma: Secondary | ICD-10-CM | POA: Insufficient documentation

## 2016-04-09 DIAGNOSIS — Z7982 Long term (current) use of aspirin: Secondary | ICD-10-CM | POA: Insufficient documentation

## 2016-04-09 DIAGNOSIS — F0390 Unspecified dementia without behavioral disturbance: Secondary | ICD-10-CM | POA: Diagnosis not present

## 2016-04-09 DIAGNOSIS — Z794 Long term (current) use of insulin: Secondary | ICD-10-CM | POA: Diagnosis not present

## 2016-04-09 DIAGNOSIS — Z79899 Other long term (current) drug therapy: Secondary | ICD-10-CM | POA: Diagnosis not present

## 2016-04-09 LAB — COMPREHENSIVE METABOLIC PANEL
ALK PHOS: 72 U/L (ref 38–126)
ALT: 20 U/L (ref 17–63)
AST: 22 U/L (ref 15–41)
Albumin: 3.5 g/dL (ref 3.5–5.0)
Anion gap: 9 (ref 5–15)
BILIRUBIN TOTAL: 1 mg/dL (ref 0.3–1.2)
BUN: 40 mg/dL — AB (ref 6–20)
CALCIUM: 9 mg/dL (ref 8.9–10.3)
CHLORIDE: 105 mmol/L (ref 101–111)
CO2: 25 mmol/L (ref 22–32)
Creatinine, Ser: 1.43 mg/dL — ABNORMAL HIGH (ref 0.61–1.24)
GFR, EST AFRICAN AMERICAN: 47 mL/min — AB (ref >60)
GFR, EST NON AFRICAN AMERICAN: 41 mL/min — AB (ref >60)
Glucose, Bld: 80 mg/dL (ref 65–99)
Potassium: 3.4 mmol/L — ABNORMAL LOW (ref 3.5–5.1)
Sodium: 139 mmol/L (ref 135–145)
Total Protein: 6.1 g/dL — ABNORMAL LOW (ref 6.5–8.1)

## 2016-04-09 LAB — CBC WITH DIFFERENTIAL/PLATELET
BASOS ABS: 0 10*3/uL (ref 0.0–0.1)
BASOS PCT: 0 %
EOS PCT: 6 %
Eosinophils Absolute: 0.5 10*3/uL (ref 0.0–0.7)
HCT: 38.4 % — ABNORMAL LOW (ref 39.0–52.0)
Hemoglobin: 12.5 g/dL — ABNORMAL LOW (ref 13.0–17.0)
LYMPHS ABS: 2.4 10*3/uL (ref 0.7–4.0)
Lymphocytes Relative: 29 %
MCH: 30.1 pg (ref 26.0–34.0)
MCHC: 32.6 g/dL (ref 30.0–36.0)
MCV: 92.5 fL (ref 78.0–100.0)
MONO ABS: 0.8 10*3/uL (ref 0.1–1.0)
Monocytes Relative: 9 %
Neutro Abs: 4.7 10*3/uL (ref 1.7–7.7)
Neutrophils Relative %: 56 %
PLATELETS: 188 10*3/uL (ref 150–400)
RBC: 4.15 MIL/uL — AB (ref 4.22–5.81)
RDW: 14.4 % (ref 11.5–15.5)
WBC: 8.3 10*3/uL (ref 4.0–10.5)

## 2016-04-09 LAB — URINALYSIS, ROUTINE W REFLEX MICROSCOPIC
Bilirubin Urine: NEGATIVE
Glucose, UA: NEGATIVE mg/dL
KETONES UR: NEGATIVE mg/dL
LEUKOCYTES UA: NEGATIVE
NITRITE: NEGATIVE
PROTEIN: 30 mg/dL — AB
Specific Gravity, Urine: 1.025 (ref 1.005–1.030)
pH: 5.5 (ref 5.0–8.0)

## 2016-04-09 LAB — CBG MONITORING, ED
GLUCOSE-CAPILLARY: 139 mg/dL — AB (ref 65–99)
GLUCOSE-CAPILLARY: 160 mg/dL — AB (ref 65–99)
GLUCOSE-CAPILLARY: 96 mg/dL (ref 65–99)

## 2016-04-09 LAB — URINE MICROSCOPIC-ADD ON

## 2016-04-09 MED ORDER — DEXTROSE 50 % IV SOLN
INTRAVENOUS | Status: AC
  Administered 2016-04-09: 25 mL via INTRAVENOUS
  Filled 2016-04-09: qty 50

## 2016-04-09 MED ORDER — DEXTROSE 50 % IV SOLN
25.0000 mL | Freq: Once | INTRAVENOUS | Status: AC
  Administered 2016-04-09: 25 mL via INTRAVENOUS

## 2016-04-09 NOTE — ED Provider Notes (Addendum)
[facility]-EMERGENCY DEPT Provider Note   CSN: [id number] Arrival date & time: 04/09/16  4696  First Provider Contact:  First MD Initiated Contact with Patient 04/09/16 1903        History   Chief Complaint Chief Complaint  Patient presents with  . Hypoglycemia    HPI James Pruitt is a 82 y.o. male.  The patient is a 82 year old male, he is a known diabetic as well as having high cholesterol and having a history of vascular dementia. He currently lives in a nursing facility where he has been since January. They noticed this evening that he was having some tremors in his arms and was not his normal self at which time they took his blood sugar to be 37. He was given sugar and glucagon prehospital with improvement in a sugar      Past Medical History:  Diagnosis Date  . BPH (benign prostatic hypertrophy)   . Cognitive communication deficit   . Coronary artery disease   . Coronary atherosclerosis of native coronary artery   . Dementia with behavioral problem   . Diabetes mellitus without complication (HCC)   . Dysphagia, oral phase   . Hyperlipemia   . Hyperlipidemia   . Kidney stones   . Muscle weakness (generalized)   . Osteoarthritis   . Personal history of fall   . Problems with hearing   . Thyroid disease   . Unsteady   . Vascular dementia with depressed mood   . Weakness generalized     Patient Active Problem List   Diagnosis Date Noted  . DIABETES MELLITUS 02/26/2009  . HYPERLIPIDEMIA-MIXED 02/26/2009  . MYOCARDIAL INFARCTION, INFERIOR WALL 02/26/2009  . CORONARY ARTERY BYPASS GRAFT, FOUR VESSEL, HX OF 02/26/2009    Past Surgical History:  Procedure Laterality Date  . CORONARY ARTERY BYPASS GRAFT    . surgery for kidney stones    . TOTAL KNEE ARTHROPLASTY Left   . TRANSURETHRAL RESECTION OF PROSTATE         Home Medications    Prior to Admission medications   Medication Sig Start Date End Date Taking? Authorizing Provider  acetaminophen (TYLENOL)  650 MG CR tablet Take 650 mg by mouth every 6 (six) hours as needed for pain.   Yes Historical Provider, MD  aspirin EC 81 MG tablet Take 81 mg by mouth daily.   Yes Historical Provider, MD  atorvastatin (LIPITOR) 40 MG tablet Take 40 mg by mouth at bedtime.    Yes Historical Provider, MD  cholecalciferol (VITAMIN D) 1000 units tablet Take 2,000 Units by mouth daily.   Yes Historical Provider, MD  diphenhydrAMINE (BENADRYL) 25 MG tablet Take 25 mg by mouth at bedtime as needed for sleep.   Yes Historical Provider, MD  insulin glargine (LANTUS) 100 UNIT/ML injection Inject 10-15 Units into the skin 2 (two) times daily. 10 units in the morning and 15 units at bedtime.   Yes Historical Provider, MD  insulin lispro (HUMALOG KWIKPEN) 100 UNIT/ML KiwkPen Inject 2-10 Units into the skin 4 (four) times daily -  before meals and at bedtime. 150-200 = 2 units, 201-250 = 4 units, 251-300 = 6 units, 301-350 = 8 units, 351-400 = 10 units.   Yes Historical Provider, MD  insulin lispro (HUMALOG) 100 UNIT/ML injection Inject 4 Units into the skin 2 (two) times daily.   Yes Historical Provider, MD  levothyroxine (SYNTHROID, LEVOTHROID) 75 MCG tablet Take 75 mcg by mouth at bedtime.   Yes Historical Provider, MD  LORazepam (  ATIVAN) 0.5 MG tablet Take 0.5 mg by mouth every 8 (eight) hours as needed for anxiety.   Yes Historical Provider, MD  senna-docusate (SENOKOT-S) 8.6-50 MG tablet Take 1 tablet by mouth daily.   Yes Historical Provider, MD  sertraline (ZOLOFT) 50 MG tablet Take 50 mg by mouth at bedtime.   Yes Historical Provider, MD  traMADol (ULTRAM) 50 MG tablet Take 50 mg by mouth See admin instructions. Take 1 tablet by mouth twice daily.  May also take 1 tablet every 8 hours as needed for pain > level 5.   Yes Historical Provider, MD    Family History No family history on file.  Social History Social History  Substance Use Topics  . Smoking status: Never Smoker  . Smokeless tobacco: Never Used  .  Alcohol use No     Allergies   Iohexol   Review of Systems Review of Systems  Unable to perform ROS: Dementia     Physical Exam Updated Vital Signs BP 115/58   Pulse 65   Temp 97.6 F (36.4 C) (Oral)   Resp 13   Ht 6' (1.829 m)   Wt 180 lb (81.6 kg)   SpO2 97%   BMI 24.41 kg/m   Physical Exam  Constitutional: He appears well-developed and well-nourished. No distress.  HENT:  Head: Normocephalic and atraumatic.  Mouth/Throat: Oropharynx is clear and moist. No oropharyngeal exudate.  Eyes: Conjunctivae and EOM are normal. Pupils are equal, round, and reactive to light. Right eye exhibits no discharge. Left eye exhibits no discharge. No scleral icterus.  Neck: Normal range of motion. Neck supple. No JVD present. No thyromegaly present.  Cardiovascular: Normal rate, regular rhythm, normal heart sounds and intact distal pulses.  Exam reveals no gallop and no friction rub.   No murmur heard. Pulmonary/Chest: Effort normal and breath sounds normal. No respiratory distress. He has no wheezes. He has no rales.  Abdominal: Soft. Bowel sounds are normal. He exhibits no distension and no mass. There is no tenderness.  Musculoskeletal: Normal range of motion. He exhibits no edema or tenderness.  Lymphadenopathy:    He has no cervical adenopathy.  Neurological: He is alert. Coordination normal.  Skin: Skin is warm and dry. No rash noted. No erythema.  Psychiatric: He has a normal mood and affect. His behavior is normal.  Nursing note and vitals reviewed.    ED Treatments / Results  Labs (all labs ordered are listed, but only abnormal results are displayed) Labs Reviewed  COMPREHENSIVE METABOLIC PANEL - Abnormal; Notable for the following:       Result Value   Potassium 3.4 (*)    BUN 40 (*)    Creatinine, Ser 1.43 (*)    Total Protein 6.1 (*)    GFR calc non Af Amer 41 (*)    GFR calc Af Amer 47 (*)    All other components within normal limits  CBC WITH  DIFFERENTIAL/PLATELET - Abnormal; Notable for the following:    RBC 4.15 (*)    Hemoglobin 12.5 (*)    HCT 38.4 (*)    All other components within normal limits  URINALYSIS, ROUTINE W REFLEX MICROSCOPIC (NOT AT [facility]) - Abnormal; Notable for the following:    Hgb urine dipstick SMALL (*)    Protein, ur 30 (*)    All other components within normal limits  URINE MICROSCOPIC-ADD ON - Abnormal; Notable for the following:    Squamous Epithelial / LPF 0-5 (*)    Bacteria, UA  FEW (*)    All other components within normal limits  CBG MONITORING, ED - Abnormal; Notable for the following:    Glucose-Capillary 139 (*)    All other components within normal limits  CBG MONITORING, ED - Abnormal; Notable for the following:    Glucose-Capillary 160 (*)    All other components within normal limits  CBG MONITORING, ED    ED ECG REPORT  I personally interpreted this EKG   Date: 04/09/2016   Rate: 68  Rhythm: normal sinus rhythm  QRS Axis: indeterminate  Intervals: normal  ST/T Wave abnormalities: normal  Conduction Disutrbances:none  Narrative Interpretation: Q waves present inferior leads  Old EKG Reviewed: none available  Radiology No results found.  Procedures Procedures (including critical care time)  Medications Ordered in ED Medications  dextrose 50 % solution 25 mL (25 mLs Intravenous Given 04/09/16 2058)     Initial Impression / Assessment and Plan / ED Course  I have reviewed the triage vital signs and the nursing notes.  Pertinent labs & imaging results that were available during my care of the patient were reviewed by me and considered in my medical decision making (see chart for details).  Clinical Course  Comment By Time  The patient does have dementia, he is unable to give any valuable information, his vital signs are unremarkable here, his labs are also rather unremarkable thus far with normal blood counts other than minimal anemia, normal electrolytes other than  minimal hypokalemia and a baseline creatinine of 1.4 which is not considerably abnormal from his baseline as well. We'll continue to follow his glucose, supplement as needed, the etiology of hypoglycemia is unclear Eber Hong, MD 08/10 1950  Labs unremarkable - has kept CBG in normal range - tolerating PO, n abnrmal VS, UA clean - at nursing facility where he can continue to be checked tonight. Eber Hong, MD 08/10 2202     Final Clinical Impressions(s) / ED Diagnoses   Final diagnoses:  Hypoglycemia    New Prescriptions New Prescriptions   No medications on file     Eber Hong, MD 04/09/16 2204    Eber Hong, MD 05/20/16 1019

## 2016-04-09 NOTE — ED Triage Notes (Signed)
Pt brought in by EMS from [facility]. Per EMS CBG 37. Staff gave sugar and  glucagon and CBG 176. Staff states he was having tremors.

## 2016-04-09 NOTE — ED Notes (Signed)
Spoke with pt wife, Corrie DandyMary and her and son are leaving to go home. She request that we call her at 7 am in the morning to let her know status of pt, and whether he was discharged or admitted. Phone number is [phone].

## 2016-04-10 LAB — CBG MONITORING, ED: Glucose-Capillary: 72 mg/dL (ref 65–99)

## 2016-06-06 ENCOUNTER — Emergency Department (HOSPITAL_COMMUNITY): Payer: Medicare Other

## 2016-06-06 ENCOUNTER — Encounter (HOSPITAL_COMMUNITY): Payer: Self-pay | Admitting: Emergency Medicine

## 2016-06-06 ENCOUNTER — Observation Stay (HOSPITAL_COMMUNITY)
Admission: EM | Admit: 2016-06-06 | Discharge: 2016-06-08 | Disposition: A | Discharge status: Home / Self Care | Payer: Medicare Other | Attending: Internal Medicine | Admitting: Internal Medicine

## 2016-06-06 DIAGNOSIS — E039 Hypothyroidism, unspecified: Secondary | ICD-10-CM | POA: Diagnosis present

## 2016-06-06 DIAGNOSIS — R1013 Epigastric pain: Secondary | ICD-10-CM | POA: Insufficient documentation

## 2016-06-06 DIAGNOSIS — G9340 Encephalopathy, unspecified: Secondary | ICD-10-CM | POA: Diagnosis present

## 2016-06-06 DIAGNOSIS — R410 Disorientation, unspecified: Secondary | ICD-10-CM | POA: Diagnosis not present

## 2016-06-06 DIAGNOSIS — Z7982 Long term (current) use of aspirin: Secondary | ICD-10-CM | POA: Insufficient documentation

## 2016-06-06 DIAGNOSIS — I2510 Atherosclerotic heart disease of native coronary artery without angina pectoris: Secondary | ICD-10-CM | POA: Diagnosis not present

## 2016-06-06 DIAGNOSIS — E119 Type 2 diabetes mellitus without complications: Secondary | ICD-10-CM | POA: Diagnosis not present

## 2016-06-06 DIAGNOSIS — Z794 Long term (current) use of insulin: Secondary | ICD-10-CM | POA: Insufficient documentation

## 2016-06-06 DIAGNOSIS — F418 Other specified anxiety disorders: Secondary | ICD-10-CM | POA: Diagnosis present

## 2016-06-06 DIAGNOSIS — Z951 Presence of aortocoronary bypass graft: Secondary | ICD-10-CM

## 2016-06-06 DIAGNOSIS — N183 Chronic kidney disease, stage 3 unspecified: Secondary | ICD-10-CM | POA: Diagnosis present

## 2016-06-06 DIAGNOSIS — IMO0001 Reserved for inherently not codable concepts without codable children: Secondary | ICD-10-CM

## 2016-06-06 DIAGNOSIS — R4182 Altered mental status, unspecified: Secondary | ICD-10-CM | POA: Diagnosis present

## 2016-06-06 DIAGNOSIS — Z79899 Other long term (current) drug therapy: Secondary | ICD-10-CM | POA: Insufficient documentation

## 2016-06-06 LAB — COMPREHENSIVE METABOLIC PANEL
ALBUMIN: 3.7 g/dL (ref 3.5–5.0)
ALK PHOS: 75 U/L (ref 38–126)
ALT: 22 U/L (ref 17–63)
ANION GAP: 4 — AB (ref 5–15)
AST: 23 U/L (ref 15–41)
BUN: 32 mg/dL — ABNORMAL HIGH (ref 6–20)
CALCIUM: 9.6 mg/dL (ref 8.9–10.3)
CHLORIDE: 104 mmol/L (ref 101–111)
CO2: 28 mmol/L (ref 22–32)
CREATININE: 1.47 mg/dL — AB (ref 0.61–1.24)
GFR calc non Af Amer: 39 mL/min — ABNORMAL LOW (ref >60)
GFR, EST AFRICAN AMERICAN: 46 mL/min — AB (ref >60)
GLUCOSE: 151 mg/dL — AB (ref 65–99)
Potassium: 4.2 mmol/L (ref 3.5–5.1)
SODIUM: 136 mmol/L (ref 135–145)
Total Bilirubin: 1.7 mg/dL — ABNORMAL HIGH (ref 0.3–1.2)
Total Protein: 6.4 g/dL — ABNORMAL LOW (ref 6.5–8.1)

## 2016-06-06 LAB — CBC WITH DIFFERENTIAL/PLATELET
Basophils Absolute: 0 10*3/uL (ref 0.0–0.1)
Basophils Relative: 0 %
Eosinophils Absolute: 0.5 10*3/uL (ref 0.0–0.7)
Eosinophils Relative: 7 %
HEMATOCRIT: 37.9 % — AB (ref 39.0–52.0)
HEMOGLOBIN: 12.8 g/dL — AB (ref 13.0–17.0)
LYMPHS ABS: 1.7 10*3/uL (ref 0.7–4.0)
Lymphocytes Relative: 24 %
MCH: 31.3 pg (ref 26.0–34.0)
MCHC: 33.8 g/dL (ref 30.0–36.0)
MCV: 92.7 fL (ref 78.0–100.0)
MONO ABS: 0.7 10*3/uL (ref 0.1–1.0)
MONOS PCT: 10 %
NEUTROS ABS: 4 10*3/uL (ref 1.7–7.7)
NEUTROS PCT: 59 %
Platelets: 177 10*3/uL (ref 150–400)
RBC: 4.09 MIL/uL — ABNORMAL LOW (ref 4.22–5.81)
RDW: 13.4 % (ref 11.5–15.5)
WBC: 7 10*3/uL (ref 4.0–10.5)

## 2016-06-06 LAB — ETHANOL: Alcohol, Ethyl (B): <5 mg/dL (ref <5)

## 2016-06-06 LAB — URINALYSIS, ROUTINE W REFLEX MICROSCOPIC
BILIRUBIN URINE: NEGATIVE
GLUCOSE, UA: NEGATIVE mg/dL
HGB URINE DIPSTICK: NEGATIVE
Ketones, ur: NEGATIVE mg/dL
Leukocytes, UA: NEGATIVE
Nitrite: NEGATIVE
PH: 6 (ref 5.0–8.0)
Protein, ur: 100 mg/dL — AB
SPECIFIC GRAVITY, URINE: 1.025 (ref 1.005–1.030)

## 2016-06-06 LAB — I-STAT CHEM 8, ED
BUN: 29 mg/dL — ABNORMAL HIGH (ref 6–20)
CHLORIDE: 101 mmol/L (ref 101–111)
Calcium, Ion: 1.28 mmol/L (ref 1.15–1.40)
Creatinine, Ser: 1.4 mg/dL — ABNORMAL HIGH (ref 0.61–1.24)
GLUCOSE: 148 mg/dL — AB (ref 65–99)
HEMATOCRIT: 38 % — AB (ref 39.0–52.0)
HEMOGLOBIN: 12.9 g/dL — AB (ref 13.0–17.0)
POTASSIUM: 4.1 mmol/L (ref 3.5–5.1)
SODIUM: 139 mmol/L (ref 135–145)
TCO2: 27 mmol/L (ref 0–100)

## 2016-06-06 LAB — URINE MICROSCOPIC-ADD ON

## 2016-06-06 LAB — AMMONIA: AMMONIA: 15 umol/L (ref 9–35)

## 2016-06-06 MED ORDER — SODIUM CHLORIDE 0.9 % IV BOLUS (SEPSIS)
1000.0000 mL | Freq: Once | INTRAVENOUS | Status: AC
  Administered 2016-06-06: 1000 mL via INTRAVENOUS

## 2016-06-06 MED ORDER — IOPAMIDOL (ISOVUE-300) INJECTION 61%
INTRAVENOUS | Status: AC
  Filled 2016-06-06: qty 30

## 2016-06-06 MED ORDER — SODIUM CHLORIDE 0.9 % IV BOLUS (SEPSIS)
500.0000 mL | Freq: Once | INTRAVENOUS | Status: AC
  Administered 2016-06-06: 500 mL via INTRAVENOUS

## 2016-06-06 NOTE — ED Notes (Signed)
Patient fidgety and anxious at this time. Patient had feet through side rails of the bed. Patient repositioned.

## 2016-06-06 NOTE — ED Notes (Signed)
Patient transported to CT 

## 2016-06-06 NOTE — ED Triage Notes (Signed)
Pt brought in EMS from [facility] for evaluation of altered mental status. LKW yesterday. Per Facility, pt has had increased confusion and slurred speech since yesterday pt has history of TIA. Pt also complaining of right hip with no known injury.

## 2016-06-06 NOTE — ED Provider Notes (Signed)
[facility]-EMERGENCY DEPT Provider Note   CSN: [id number] Arrival date & time: 06/06/16  1819     History   Chief Complaint Chief Complaint  Patient presents with  . Altered Mental Status    HPI James Pruitt is a 82 y.o. male.  82 yo M with a chief complaint of altered mental status. Level V caveat altered mental status. His last seen normal yesterday. Patient has had slurred speech and confusion. On my exam patient is unable to contribute to the history.   The history is provided by the patient.  Altered Mental Status   This is a new problem. The current episode started yesterday. The problem has not changed since onset.Associated symptoms include confusion. His past medical history does not include seizures.    Past Medical History:  Diagnosis Date  . BPH (benign prostatic hypertrophy)   . Cognitive communication deficit   . Coronary artery disease   . Coronary atherosclerosis of native coronary artery   . Dementia with behavioral problem   . Diabetes mellitus without complication (HCC)   . Dysphagia, oral phase   . Hyperlipemia   . Hyperlipidemia   . Kidney stones   . Muscle weakness (generalized)   . Osteoarthritis   . Personal history of fall   . Problems with hearing   . Thyroid disease   . Unsteady   . Vascular dementia with depressed mood   . Weakness generalized     Patient Active Problem List   Diagnosis Date Noted  . Delirium 06/06/2016  . DIABETES MELLITUS 02/26/2009  . HYPERLIPIDEMIA-MIXED 02/26/2009  . MYOCARDIAL INFARCTION, INFERIOR WALL 02/26/2009  . CORONARY ARTERY BYPASS GRAFT, FOUR VESSEL, HX OF 02/26/2009    Past Surgical History:  Procedure Laterality Date  . CORONARY ARTERY BYPASS GRAFT    . surgery for kidney stones    . TOTAL KNEE ARTHROPLASTY Left   . TRANSURETHRAL RESECTION OF PROSTATE         Home Medications    Prior to Admission medications   Medication Sig Start Date End Date Taking? Authorizing Provider  acetaminophen  (TYLENOL) 325 MG tablet Take 650 mg by mouth every 6 (six) hours as needed for mild pain, moderate pain or headache.   Yes Historical Provider, MD  acetaminophen (TYLENOL) 650 MG CR tablet Take 650 mg by mouth every 6 (six) hours as needed for pain.   Yes Historical Provider, MD  aspirin EC 81 MG tablet Take 81 mg by mouth daily.   Yes Historical Provider, MD  atorvastatin (LIPITOR) 40 MG tablet Take 40 mg by mouth at bedtime.    Yes Historical Provider, MD  cholecalciferol (VITAMIN D) 1000 units tablet Take 2,000 Units by mouth daily.   Yes Historical Provider, MD  diphenhydrAMINE (BENADRYL) 25 MG tablet Take 25 mg by mouth at bedtime as needed for sleep.   Yes Historical Provider, MD  insulin glargine (LANTUS) 100 unit/mL SOPN Inject 10 Units into the skin daily.   Yes Historical Provider, MD  insulin lispro (HUMALOG KWIKPEN) 100 UNIT/ML KiwkPen Inject 2-10 Units into the skin 4 (four) times daily -  before meals and at bedtime. Pt uses per sliding scale:    150-200:  2 units  201-250:  4 units  251-300:  6 units  301-350:  8 units  351-400:  10 units Less than 60 or greater than 400 -- notify MD.   Yes Historical Provider, MD  levothyroxine (SYNTHROID, LEVOTHROID) 75 MCG tablet Take 75 mcg by mouth at bedtime.  Yes Historical Provider, MD  LORazepam (ATIVAN) 0.5 MG tablet Take 0.5 mg by mouth every 8 (eight) hours as needed for anxiety.   Yes Historical Provider, MD  senna-docusate (SENOKOT-S) 8.6-50 MG tablet Take 1 tablet by mouth 2 (two) times daily.    Yes Historical Provider, MD  sertraline (ZOLOFT) 50 MG tablet Take 50 mg by mouth at bedtime.   Yes Historical Provider, MD  traMADol (ULTRAM) 50 MG tablet Take 50 mg by mouth every 8 (eight) hours as needed for moderate pain.    Yes Historical Provider, MD    Family History History reviewed. No pertinent family history.  Social History Social History  Substance Use Topics  . Smoking status: Never Smoker  . Smokeless tobacco: Never  Used  . Alcohol use No     Allergies   Iohexol   Review of Systems Review of Systems  Unable to perform ROS: Mental status change  Psychiatric/Behavioral: Positive for confusion.     Physical Exam Updated Vital Signs BP 182/79   Pulse 96   Temp 98.6 F (37 C) (Oral)   Resp 20   Ht 5\' 11"  (1.803 m)   Wt 180 lb (81.6 kg)   SpO2 98%   BMI 25.10 kg/m   Physical Exam  Constitutional: He appears well-developed and well-nourished.  HENT:  Head: Normocephalic and atraumatic.  Eyes: EOM are normal. Pupils are equal, round, and reactive to light.  Neck: Normal range of motion. Neck supple. No JVD present.  Cardiovascular: Normal rate and regular rhythm.  Exam reveals no gallop and no friction rub.   No murmur heard. Pulmonary/Chest: No respiratory distress. He has no wheezes.  Abdominal: He exhibits no distension and no mass. There is tenderness (epigastric). There is no rebound and no guarding.  Musculoskeletal: Normal range of motion.  Neurological: He is alert. He is disoriented.  Skin: No rash noted. No pallor.  Psychiatric: He has a normal mood and affect. His behavior is normal.  Nursing note and vitals reviewed.    ED Treatments / Results  Labs (all labs ordered are listed, but only abnormal results are displayed) Labs Reviewed  COMPREHENSIVE METABOLIC PANEL - Abnormal; Notable for the following:       Result Value   Glucose, Bld 151 (*)    BUN 32 (*)    Creatinine, Ser 1.47 (*)    Total Protein 6.4 (*)    Total Bilirubin 1.7 (*)    GFR calc non Af Amer 39 (*)    GFR calc Af Amer 46 (*)    Anion gap 4 (*)    All other components within normal limits  CBC WITH DIFFERENTIAL/PLATELET - Abnormal; Notable for the following:    RBC 4.09 (*)    Hemoglobin 12.8 (*)    HCT 37.9 (*)    All other components within normal limits  URINALYSIS, ROUTINE W REFLEX MICROSCOPIC (NOT AT [facility]) - Abnormal; Notable for the following:    Protein, ur 100 (*)    All other  components within normal limits  URINE MICROSCOPIC-ADD ON - Abnormal; Notable for the following:    Squamous Epithelial / LPF 0-5 (*)    Bacteria, UA FEW (*)    All other components within normal limits  I-STAT CHEM 8, ED - Abnormal; Notable for the following:    BUN 29 (*)    Creatinine, Ser 1.40 (*)    Glucose, Bld 148 (*)    Hemoglobin 12.9 (*)    HCT 38.0 (*)  All other components within normal limits  URINE CULTURE  AMMONIA  ETHANOL  I-STAT CG4 LACTIC ACID, ED    EKG  EKG Interpretation  Date/Time:  Saturday June 06 2016 18:31:12 EDT Ventricular Rate:  71 PR Interval:    QRS Duration: 128 QT Interval:  444 QTC Calculation: 483 R Axis:   24 Text Interpretation:  Sinus rhythm Nonspecific intraventricular conduction delay Inferior infarct, old background noise Otherwise no significant change Confirmed by Eloise Mula MD, DANIEL [phone]) on 06/06/2016 6:34:47 PM       Radiology Ct Abdomen Pelvis Wo Contrast  Result Date: 06/06/2016 CLINICAL DATA:  Altered mental status and right hip pain. Contrast allergy. EXAM: CT ABDOMEN AND PELVIS WITHOUT CONTRAST TECHNIQUE: Multidetector CT imaging of the abdomen and pelvis was performed following the standard protocol without IV contrast. COMPARISON:  10/27/2012 FINDINGS: Lower chest: Atelectasis in the lung bases. Coronary artery bypass surgery. Small esophageal hiatal hernia. Hepatobiliary: Unenhanced appearance is unremarkable. Pancreas: Fatty atrophy of the pancreas.  No inflammatory changes. Spleen: Unenhanced appearance is unremarkable. Adrenals/Urinary Tract: No adrenal gland nodules. Calcifications in the kidneys, likely representing vascular calcifications. There is a stone in the left renal pelvis measuring 6 mm in diameter. There is no hydronephrosis. Distal ureter is decompressed. No additional ureteral stones or bladder stones demonstrated. Bladder wall is not thickened. Sub cm cyst in the right kidney. Stomach/Bowel: Stomach, small  bowel, and colon are decompressed, limiting evaluation. Scattered diverticula in the colon. Diverticulosis of the sigmoid colon. No changes to suggest diverticulitis. Appendix is normal. Vascular/Lymphatic: Calcification of the abdominal aorta and branch vessels. No aneurysm. No significant lymphadenopathy. Reproductive: Prostate gland is enlarged. Other: No abdominal wall hernia or abnormality. No abdominopelvic ascites. Musculoskeletal: Thoracolumbar scoliosis and degenerative changes. Degenerative changes in the hips. Motion artifact limits evaluation but no evidence of fracture or dislocation of the right hip. Pelvis appears intact. IMPRESSION: No acute process demonstrated in the abdomen or pelvis. 6 mm stone in the left renal pelvis but without hydronephrosis. Aortic atherosclerosis with extensive calcification throughout the vasculature. Thoracolumbar scoliosis and degenerative changes. Electronically Signed   By: Burman Nieves M.D.   On: 06/06/2016 22:31   Dg Chest 1 View  Result Date: 06/06/2016 CLINICAL DATA:  Altered mental status. Increased confusion and slurred speech since yesterday. EXAM: CHEST 1 VIEW COMPARISON:  02/05/2012 FINDINGS: Changes from CABG surgery are stable. There is moderate enlargement of the cardiopericardial silhouette. No mediastinal or hilar masses or evidence of adenopathy. Clear lungs.  No pleural effusion or pneumothorax. Intra-articular bodies lie adjacent to the left glenohumeral joint, stable. IMPRESSION: 1. No acute cardiopulmonary disease. Stable appearance from the prior study. Electronically Signed   By: Amie Portland M.D.   On: 06/06/2016 19:36   Ct Head Wo Contrast  Result Date: 06/06/2016 CLINICAL DATA:  Increased confusion and slurred speech since yesterday. EXAM: CT HEAD WITHOUT CONTRAST TECHNIQUE: Contiguous axial images were obtained from the base of the skull through the vertex without intravenous contrast. COMPARISON:  01/02/2016 FINDINGS: Brain: There  is ventricular and sulcal enlargement reflecting moderate to advanced atrophy, stable. No hydrocephalus. There are multiple old lacune infarcts most evident in the thalami and basal ganglia. There is extensive white matter hypoattenuation consistent with chronic microvascular ischemic change. Small old cortical infarcts are noted involving the occipital lobes with old cerebellar infarcts also noted. These findings are stable. There are no parenchymal masses or mass effect. There is no evidence of a recent cortical infarct. There are no extra-axial masses or  abnormal fluid collections. There is no intracranial hemorrhage. Vascular: There is dilation of the basilar artery and prominence of the supraclinoid internal carotid arteries with significant calcifications. This is stable. No evidence of vessel thrombosis. Skull: Normal. Negative for fracture or focal lesion. Sinuses/Orbits: No acute finding. Other: None. IMPRESSION: 1. No acute intracranial abnormalities. 2. Atrophy, advanced chronic microvascular ischemic change and multiple old infarcts, stable from the prior study. Electronically Signed   By: Amie Portlandavid  Ormond M.D.   On: 06/06/2016 19:35    Procedures Procedures (including critical care time)  Medications Ordered in ED Medications  iopamidol (ISOVUE-300) 61 % injection (not administered)  sodium chloride 0.9 % bolus 1,000 mL (0 mLs Intravenous Stopped 06/06/16 2225)  sodium chloride 0.9 % bolus 500 mL (0 mLs Intravenous Stopped 06/06/16 2346)     Initial Impression / Assessment and Plan / ED Course  I have reviewed the triage vital signs and the nursing notes.  Pertinent labs & imaging results that were available during my care of the patient were reviewed by me and considered in my medical decision making (see chart for details).  Clinical Course    82 yo M With altered mental status. Patient is disoriented but alert. Able to move all 4 extremities. Will obtain altered mental status workup.  Patient's last visit in the ED was noted to be hypoglycemic.   Labs unremarkable, no noted findings. Discussed with nursing home, according to them, He is deathly not at his current baseline. Will admit.  The patients results and plan were reviewed and discussed.   Any x-rays performed were independently reviewed by myself.   Differential diagnosis were considered with the presenting HPI.  Medications  iopamidol (ISOVUE-300) 61 % injection (not administered)  sodium chloride 0.9 % bolus 1,000 mL (0 mLs Intravenous Stopped 06/06/16 2225)  sodium chloride 0.9 % bolus 500 mL (0 mLs Intravenous Stopped 06/06/16 2346)    Vitals:   06/06/16 2000 06/06/16 2006 06/06/16 2100 06/06/16 2232  BP: 102/70  139/92 182/79  Pulse: 96     Resp: 18  22 20   Temp:      TempSrc:      SpO2: (!) 82% 98%    Weight:      Height:        Final diagnoses:  Delirious    Admission/ observation were discussed with the admitting physician, patient and/or family and they are comfortable with the plan.    Final Clinical Impressions(s) / ED Diagnoses   Final diagnoses:  Delirious    New Prescriptions New Prescriptions   No medications on file     Melene PlanDan Nickisha Hum, DO 06/06/16 2357

## 2016-06-07 ENCOUNTER — Encounter (HOSPITAL_COMMUNITY): Payer: Self-pay | Admitting: Family Medicine

## 2016-06-07 DIAGNOSIS — Z951 Presence of aortocoronary bypass graft: Secondary | ICD-10-CM

## 2016-06-07 DIAGNOSIS — G9340 Encephalopathy, unspecified: Secondary | ICD-10-CM | POA: Diagnosis not present

## 2016-06-07 DIAGNOSIS — F418 Other specified anxiety disorders: Secondary | ICD-10-CM | POA: Diagnosis present

## 2016-06-07 DIAGNOSIS — E119 Type 2 diabetes mellitus without complications: Secondary | ICD-10-CM

## 2016-06-07 DIAGNOSIS — N183 Chronic kidney disease, stage 3 unspecified: Secondary | ICD-10-CM | POA: Diagnosis present

## 2016-06-07 DIAGNOSIS — R410 Disorientation, unspecified: Secondary | ICD-10-CM | POA: Diagnosis not present

## 2016-06-07 DIAGNOSIS — Z794 Long term (current) use of insulin: Secondary | ICD-10-CM

## 2016-06-07 DIAGNOSIS — E039 Hypothyroidism, unspecified: Secondary | ICD-10-CM | POA: Diagnosis present

## 2016-06-07 LAB — BASIC METABOLIC PANEL
Anion gap: 6 (ref 5–15)
BUN: 27 mg/dL — ABNORMAL HIGH (ref 6–20)
CALCIUM: 9 mg/dL (ref 8.9–10.3)
CO2: 26 mmol/L (ref 22–32)
CREATININE: 1.25 mg/dL — AB (ref 0.61–1.24)
Chloride: 106 mmol/L (ref 101–111)
GFR, EST AFRICAN AMERICAN: 55 mL/min — AB (ref >60)
GFR, EST NON AFRICAN AMERICAN: 48 mL/min — AB (ref >60)
Glucose, Bld: 98 mg/dL (ref 65–99)
Potassium: 3.9 mmol/L (ref 3.5–5.1)
SODIUM: 138 mmol/L (ref 135–145)

## 2016-06-07 LAB — GLUCOSE, CAPILLARY
Glucose-Capillary: 96 mg/dL (ref 65–99)
Glucose-Capillary: 139 mg/dL — ABNORMAL HIGH (ref 65–99)
Glucose-Capillary: 137 mg/dL — ABNORMAL HIGH (ref 65–99)

## 2016-06-07 LAB — MRSA PCR SCREENING
MRSA BY PCR: NEGATIVE
MRSA BY PCR: NEGATIVE

## 2016-06-07 LAB — TSH: TSH: 1.428 u[IU]/mL (ref 0.350–4.500)

## 2016-06-07 LAB — T4, FREE: FREE T4: 1.05 ng/dL (ref 0.61–1.12)

## 2016-06-07 MED ORDER — INSULIN ASPART 100 UNIT/ML ~~LOC~~ SOLN: 0.0000 [IU] | Freq: Three times a day (TID) | SUBCUTANEOUS | Status: DC

## 2016-06-07 MED ORDER — ATORVASTATIN CALCIUM 40 MG PO TABS
40.0000 mg | ORAL_TABLET | Freq: Every day | ORAL | Status: DC
  Administered 2016-06-07 (×2): 40 mg via ORAL
  Filled 2016-06-07 (×2): qty 1

## 2016-06-07 MED ORDER — TRAMADOL HCL 50 MG PO TABS: 50.0000 mg | ORAL_TABLET | Freq: Three times a day (TID) | ORAL | Status: DC | PRN

## 2016-06-07 MED ORDER — LORAZEPAM 0.5 MG PO TABS
0.5000 mg | ORAL_TABLET | Freq: Three times a day (TID) | ORAL | Status: DC | PRN
  Administered 2016-06-07: 0.5 mg via ORAL
  Filled 2016-06-07: qty 1

## 2016-06-07 MED ORDER — SERTRALINE HCL 50 MG PO TABS
50.0000 mg | ORAL_TABLET | Freq: Every day | ORAL | Status: DC
  Administered 2016-06-07 (×2): 50 mg via ORAL
  Filled 2016-06-07 (×2): qty 1

## 2016-06-07 MED ORDER — SODIUM CHLORIDE 0.9 % IV SOLN
INTRAVENOUS | Status: AC
  Administered 2016-06-07: 03:00:00 via INTRAVENOUS

## 2016-06-07 MED ORDER — ONDANSETRON HCL 4 MG PO TABS: 4.0000 mg | ORAL_TABLET | Freq: Four times a day (QID) | ORAL | Status: DC | PRN

## 2016-06-07 MED ORDER — INFLUENZA VAC SPLIT QUAD 0.5 ML IM SUSY: 0.5000 mL | PREFILLED_SYRINGE | INTRAMUSCULAR | Status: DC

## 2016-06-07 MED ORDER — LEVOTHYROXINE SODIUM 75 MCG PO TABS
75.0000 ug | ORAL_TABLET | Freq: Every day | ORAL | Status: DC
  Administered 2016-06-07 (×2): 75 ug via ORAL
  Filled 2016-06-07 (×2): qty 1

## 2016-06-07 MED ORDER — ASPIRIN EC 81 MG PO TBEC: 81.0000 mg | DELAYED_RELEASE_TABLET | Freq: Every day | ORAL | Status: DC

## 2016-06-07 MED ORDER — HEPARIN SODIUM (PORCINE) 5000 UNIT/ML IJ SOLN
5000.0000 [IU] | Freq: Three times a day (TID) | INTRAMUSCULAR | Status: DC
  Administered 2016-06-07 – 2016-06-08 (×3): 5000 [IU] via SUBCUTANEOUS
  Filled 2016-06-07 (×2): qty 1

## 2016-06-07 MED ORDER — ONDANSETRON HCL 4 MG/2ML IJ SOLN: 4.0000 mg | Freq: Four times a day (QID) | INTRAMUSCULAR | Status: DC | PRN

## 2016-06-07 MED ORDER — VITAMIN D 1000 UNITS PO TABS: 2000.0000 [IU] | ORAL_TABLET | Freq: Every day | ORAL | Status: DC

## 2016-06-07 MED ORDER — ORAL CARE MOUTH RINSE
15.0000 mL | Freq: Two times a day (BID) | OROMUCOSAL | Status: DC
  Administered 2016-06-07: 15 mL via OROMUCOSAL

## 2016-06-07 MED ORDER — ACETAMINOPHEN 325 MG PO TABS: 650.0000 mg | ORAL_TABLET | Freq: Four times a day (QID) | ORAL | Status: DC | PRN

## 2016-06-07 MED ORDER — SENNOSIDES-DOCUSATE SODIUM 8.6-50 MG PO TABS
1.0000 | ORAL_TABLET | Freq: Two times a day (BID) | ORAL | Status: DC
  Administered 2016-06-07 (×2): 1 via ORAL
  Filled 2016-06-07 (×2): qty 1

## 2016-06-07 MED ORDER — INSULIN GLARGINE 100 UNIT/ML ~~LOC~~ SOLN
10.0000 [IU] | Freq: Every day | SUBCUTANEOUS | Status: DC
  Administered 2016-06-07 (×2): 10 [IU] via SUBCUTANEOUS
  Filled 2016-06-07 (×3): qty 0.1

## 2016-06-07 MED ORDER — PNEUMOCOCCAL VAC POLYVALENT 25 MCG/0.5ML IJ INJ: 0.5000 mL | INJECTION | INTRAMUSCULAR | Status: DC

## 2016-06-07 NOTE — H&P (Signed)
History and Physical    James Pruitt ZOX:[id number] DOB: [birth year] DOA: 06/06/2016  PCP: Pearson Grippe, MD   Patient coming from: SNF  Chief Complaint: AMS  HPI: James Pruitt is a 82 y.o. male with medical history significant for coronary artery disease status post CABG, dementia, depression, anxiety, insulin-dependent diabetes mellitus, and hypothyroidism who presents to the emergency department for evaluation of altered mental status. History is obtained through discussion with the SNF personnel, ED personnel, and reviewed electronic medical record. Patient had reportedly been in his usual state of health, alert and communicative, until 06/06/2016 when SNF staff noted him to be lethargic and confused. He was reportedly difficult to arouse and not communicating. This carried on throughout the day and he was sent in to the ED for evaluation. There have been no recent fevers, chills, or cough noted. There is been no recent change in the patient's medication and no apparent fall or trauma. Vitals have reportedly been stable. Patient is not known to use alcohol or illicit drugs.  ED Course: Upon arrival to the ED, patient is found to be afebrile, saturating well on room air, and with vital signs stable. EKG demonstrates a sinus rhythm with nonspecific interventricular conduction delay. Chest x-ray is negative for acute cardiopulmonary disease. Chemistry panel features a BUN of 32 and serum creatinine 1.47 which appears consistent with his baseline. CMP is also notable for a new mild hyperbilirubinemia with total bilirubin 1.7. Ammonia is within the normal limits at 15 and ethanol is undetectable. CBC features a stable normocytic anemia with hemoglobin of 12.8. Urinalysis is obtained and not suggestive of infection. Urine was sent for culture. Head CT was negative for acute intracranial abnormality. Given the new hyperbilirubinemia and unclear etiology of the patient's presentation, CT of the abdomen and  pelvis was also obtained, and also negative for acute pathology. Patient was given 1.5 L of normal saline in the emergency department. He remained hemodynamically stable and in no apparent respiratory distress, but continued to be somnolent and not verbally communicative. He'll be observed in the medical/surgical unit for ongoing evaluation and management of acute encephalopathy with unclear etiology.  Review of Systems:  All other systems reviewed and apart from HPI, are negative.  Past Medical History:  Diagnosis Date  . BPH (benign prostatic hypertrophy)   . Cognitive communication deficit   . Coronary artery disease   . Coronary atherosclerosis of native coronary artery   . Dementia with behavioral problem   . Diabetes mellitus without complication (HCC)   . Dysphagia, oral phase   . Hyperlipemia   . Hyperlipidemia   . Kidney stones   . Muscle weakness (generalized)   . Osteoarthritis   . Personal history of fall   . Problems with hearing   . Thyroid disease   . Unsteady   . Vascular dementia with depressed mood   . Weakness generalized     Past Surgical History:  Procedure Laterality Date  . CORONARY ARTERY BYPASS GRAFT    . surgery for kidney stones    . TOTAL KNEE ARTHROPLASTY Left   . TRANSURETHRAL RESECTION OF PROSTATE       reports that he has never smoked. He has never used smokeless tobacco. He reports that he does not drink alcohol or use drugs.  Allergies  Allergen Reactions  . Iohexol Other (See Comments)    Reaction:  Pt passed out      History reviewed. No pertinent family history.   Prior to Admission medications  Medication Sig Start Date End Date Taking? Authorizing Provider  acetaminophen (TYLENOL) 325 MG tablet Take 650 mg by mouth every 6 (six) hours as needed for mild pain, moderate pain or headache.   Yes Historical Provider, MD  acetaminophen (TYLENOL) 650 MG CR tablet Take 650 mg by mouth every 6 (six) hours as needed for pain.   Yes  Historical Provider, MD  aspirin EC 81 MG tablet Take 81 mg by mouth daily.   Yes Historical Provider, MD  atorvastatin (LIPITOR) 40 MG tablet Take 40 mg by mouth at bedtime.    Yes Historical Provider, MD  cholecalciferol (VITAMIN D) 1000 units tablet Take 2,000 Units by mouth daily.   Yes Historical Provider, MD  diphenhydrAMINE (BENADRYL) 25 MG tablet Take 25 mg by mouth at bedtime as needed for sleep.   Yes Historical Provider, MD  insulin glargine (LANTUS) 100 unit/mL SOPN Inject 10 Units into the skin daily.   Yes Historical Provider, MD  insulin lispro (HUMALOG KWIKPEN) 100 UNIT/ML KiwkPen Inject 2-10 Units into the skin 4 (four) times daily -  before meals and at bedtime. Pt uses per sliding scale:    150-200:  2 units  201-250:  4 units  251-300:  6 units  301-350:  8 units  351-400:  10 units Less than 60 or greater than 400 -- notify MD.   Yes Historical Provider, MD  levothyroxine (SYNTHROID, LEVOTHROID) 75 MCG tablet Take 75 mcg by mouth at bedtime.   Yes Historical Provider, MD  LORazepam (ATIVAN) 0.5 MG tablet Take 0.5 mg by mouth every 8 (eight) hours as needed for anxiety.   Yes Historical Provider, MD  senna-docusate (SENOKOT-S) 8.6-50 MG tablet Take 1 tablet by mouth 2 (two) times daily.    Yes Historical Provider, MD  sertraline (ZOLOFT) 50 MG tablet Take 50 mg by mouth at bedtime.   Yes Historical Provider, MD  traMADol (ULTRAM) 50 MG tablet Take 50 mg by mouth every 8 (eight) hours as needed for moderate pain.    Yes Historical Provider, MD    Physical Exam: Vitals:   06/06/16 2006 06/06/16 2100 06/06/16 2232 06/07/16 0100  BP:  139/92 182/79 (!) 158/76  Pulse:    65  Resp:  22 20 18   Temp:      TempSrc:      SpO2: 98%   99%  Weight:      Height:          Constitutional: NAD, calm, comfortable Eyes: PERTLA, lids and conjunctivae normal ENMT: Mucous membranes are dry. Posterior pharynx clear of any exudate or lesions.   Neck: normal, supple, no masses, no  thyromegaly Respiratory: clear to auscultation bilaterally, no wheezing, no crackles. Normal respiratory effort.   Cardiovascular: S1 & S2 heard, regular rate and rhythm. No extremity edema. No significant JVD. Abdomen: No distension, no tenderness, no masses palpated. Bowel sounds normal.  Musculoskeletal: no clubbing / cyanosis. No joint deformity upper and lower extremities. Normal muscle tone.  Skin: no significant rashes, lesions, ulcers. Warm, dry, well-perfused. Poor turgor.  Neurologic: CN 2-12 grossly intact. Sensation intact, DTR normal. Strength 5/5 in all 4 limbs.  Psychiatric: Somnolent, arousable; confused, but smiling and trying to make jokes.       Labs on Admission: I have personally reviewed following labs and imaging studies  CBC:  Recent Labs Lab 06/06/16 1831 06/06/16 1848  WBC 7.0  --   NEUTROABS 4.0  --   HGB 12.8* 12.9*  HCT 37.9* 38.0*  MCV 92.7  --   PLT 177  --    Basic Metabolic Panel:  Recent Labs Lab 06/06/16 1831 06/06/16 1848  NA 136 139  K 4.2 4.1  CL 104 101  CO2 28  --   GLUCOSE 151* 148*  BUN 32* 29*  CREATININE 1.47* 1.40*  CALCIUM 9.6  --    GFR: Estimated Creatinine Clearance: 35.1 mL/min (by C-G formula based on SCr of 1.4 mg/dL (H)). Liver Function Tests:  Recent Labs Lab 06/06/16 1831  AST 23  ALT 22  ALKPHOS 75  BILITOT 1.7*  PROT 6.4*  ALBUMIN 3.7   No results for input(s): LIPASE, AMYLASE in the last 168 hours.  Recent Labs Lab 06/06/16 1957  AMMONIA 15   Coagulation Profile: No results for input(s): INR, PROTIME in the last 168 hours. Cardiac Enzymes: No results for input(s): CKTOTAL, CKMB, CKMBINDEX, TROPONINI in the last 168 hours. BNP (last 3 results) No results for input(s): PROBNP in the last 8760 hours. HbA1C: No results for input(s): HGBA1C in the last 72 hours. CBG: No results for input(s): GLUCAP in the last 168 hours. Lipid Profile: No results for input(s): CHOL, HDL, LDLCALC, TRIG,  CHOLHDL, LDLDIRECT in the last 72 hours. Thyroid Function Tests: No results for input(s): TSH, T4TOTAL, FREET4, T3FREE, THYROIDAB in the last 72 hours. Anemia Panel: No results for input(s): VITAMINB12, FOLATE, FERRITIN, TIBC, IRON, RETICCTPCT in the last 72 hours. Urine analysis:    Component Value Date/Time   COLORURINE YELLOW 06/06/2016 1952   APPEARANCEUR CLEAR 06/06/2016 1952   LABSPEC 1.025 06/06/2016 1952   PHURINE 6.0 06/06/2016 1952   GLUCOSEU NEGATIVE 06/06/2016 1952   HGBUR NEGATIVE 06/06/2016 1952   BILIRUBINUR NEGATIVE 06/06/2016 1952   KETONESUR NEGATIVE 06/06/2016 1952   PROTEINUR 100 (A) 06/06/2016 1952   UROBILINOGEN 0.2 10/27/2012 1528   NITRITE NEGATIVE 06/06/2016 1952   LEUKOCYTESUR NEGATIVE 06/06/2016 1952   Sepsis Labs: (procalcitonin:4,lacticidven:4) )No results found for this or any previous visit (from the past 240 hour(s)).   Radiological Exams on Admission: Ct Abdomen Pelvis Wo Contrast  Result Date: 06/06/2016 CLINICAL DATA:  Altered mental status and right hip pain. Contrast allergy. EXAM: CT ABDOMEN AND PELVIS WITHOUT CONTRAST TECHNIQUE: Multidetector CT imaging of the abdomen and pelvis was performed following the standard protocol without IV contrast. COMPARISON:  10/27/2012 FINDINGS: Lower chest: Atelectasis in the lung bases. Coronary artery bypass surgery. Small esophageal hiatal hernia. Hepatobiliary: Unenhanced appearance is unremarkable. Pancreas: Fatty atrophy of the pancreas.  No inflammatory changes. Spleen: Unenhanced appearance is unremarkable. Adrenals/Urinary Tract: No adrenal gland nodules. Calcifications in the kidneys, likely representing vascular calcifications. There is a stone in the left renal pelvis measuring 6 mm in diameter. There is no hydronephrosis. Distal ureter is decompressed. No additional ureteral stones or bladder stones demonstrated. Bladder wall is not thickened. Sub cm cyst in the right kidney. Stomach/Bowel:  Stomach, small bowel, and colon are decompressed, limiting evaluation. Scattered diverticula in the colon. Diverticulosis of the sigmoid colon. No changes to suggest diverticulitis. Appendix is normal. Vascular/Lymphatic: Calcification of the abdominal aorta and branch vessels. No aneurysm. No significant lymphadenopathy. Reproductive: Prostate gland is enlarged. Other: No abdominal wall hernia or abnormality. No abdominopelvic ascites. Musculoskeletal: Thoracolumbar scoliosis and degenerative changes. Degenerative changes in the hips. Motion artifact limits evaluation but no evidence of fracture or dislocation of the right hip. Pelvis appears intact. IMPRESSION: No acute process demonstrated in the abdomen or pelvis. 6 mm stone in the left renal pelvis but without hydronephrosis.  Aortic atherosclerosis with extensive calcification throughout the vasculature. Thoracolumbar scoliosis and degenerative changes. Electronically Signed   By: Burman Nieves M.D.   On: 06/06/2016 22:31   Dg Chest 1 View  Result Date: 06/06/2016 CLINICAL DATA:  Altered mental status. Increased confusion and slurred speech since yesterday. EXAM: CHEST 1 VIEW COMPARISON:  02/05/2012 FINDINGS: Changes from CABG surgery are stable. There is moderate enlargement of the cardiopericardial silhouette. No mediastinal or hilar masses or evidence of adenopathy. Clear lungs.  No pleural effusion or pneumothorax. Intra-articular bodies lie adjacent to the left glenohumeral joint, stable. IMPRESSION: 1. No acute cardiopulmonary disease. Stable appearance from the prior study. Electronically Signed   By: Amie Portland M.D.   On: 06/06/2016 19:36   Ct Head Wo Contrast  Result Date: 06/06/2016 CLINICAL DATA:  Increased confusion and slurred speech since yesterday. EXAM: CT HEAD WITHOUT CONTRAST TECHNIQUE: Contiguous axial images were obtained from the base of the skull through the vertex without intravenous contrast. COMPARISON:  01/02/2016  FINDINGS: Brain: There is ventricular and sulcal enlargement reflecting moderate to advanced atrophy, stable. No hydrocephalus. There are multiple old lacune infarcts most evident in the thalami and basal ganglia. There is extensive white matter hypoattenuation consistent with chronic microvascular ischemic change. Small old cortical infarcts are noted involving the occipital lobes with old cerebellar infarcts also noted. These findings are stable. There are no parenchymal masses or mass effect. There is no evidence of a recent cortical infarct. There are no extra-axial masses or abnormal fluid collections. There is no intracranial hemorrhage. Vascular: There is dilation of the basilar artery and prominence of the supraclinoid internal carotid arteries with significant calcifications. This is stable. No evidence of vessel thrombosis. Skull: Normal. Negative for fracture or focal lesion. Sinuses/Orbits: No acute finding. Other: None. IMPRESSION: 1. No acute intracranial abnormalities. 2. Atrophy, advanced chronic microvascular ischemic change and multiple old infarcts, stable from the prior study. Electronically Signed   By: Amie Portland M.D.   On: 06/06/2016 19:35    EKG: Independently reviewed. Sinus rhythm, non-specific IVCD   Assessment/Plan  1. Acute encephalopathy  - Head CT negative for acute findings; no acute neurologic deficit; no significant electrolyte derangement; no suggestion of infection; ammonia level wnl; EtOH <5 - Pt appears to be improving significantly since his arrival on the inpatient unit; only therapy has been IVF  - Plan to continue observation with fluid-resuscitation; if his improvement stalls, may need to extend workup   2. Insulin-dependent DM  - No A1c on file  - Managed at the SNF with Lantus 10 units qHS and Humalog 2-10 per sliding-scale  - Check CBG with meals and qHS - Continue Lantus 10 units qHS with a low-intensity sliding-scale correctional   3. CKD stage III   - SCr 1.47 on admission, consistent with his baseline  - Appears dehydrated on arrival and is receiving IVF hydration with NS    4. Depression, anxiety  - Difficult to assess given the clinical scenario - Resume Zoloft and prn Ativan as appropriate    5. CAD - No acute ischemic features on EKG  - Continue daily ASA 81     DVT prophylaxis: sq heparin Code Status: Full  Family Communication: Discussed with patient Disposition Plan: Observe on med-surg Consults called: None Admission status: Observation     Briscoe Deutscher, MD [facility] Pager [phone]  If 7PM-7AM, please contact night-coverage [email or web address] Password [email or web address]  06/07/2016, 1:36 AM

## 2016-06-07 NOTE — Progress Notes (Signed)
Patient seen and examined, database reviewed. No family members at bedside. [facility] due to increased confusion, his baseline is unclear to me. He is currently awake but is restless and is not able to carry a conversation. Etiology for his acute encephalopathy has not been found, CT head negative for findings, he has no focal neurologic deficit, no significant electrolyte derangement, no suggestion of infection, alcohol level is less than 5, ammonia level is within normal limits. May have some sundowning, will likely be [facility].  Peggye PittEstela Hernandez, MD [facility] Pager: [phone]

## 2016-06-07 NOTE — ED Notes (Signed)
Attempted to call report to [phone]. 

## 2016-06-07 NOTE — Care Management Obs Status (Signed)
MEDICARE OBSERVATION STATUS NOTIFICATION   Patient Details  Name: James Pruitt MRN: [id number] Date of Birth: 10-23-1920   Medicare Observation Status Notification Given:  Yes  Patient not able to sign form. No family at bedside. Staff witnessed signature.   Fuller Plan, RN 06/07/2016, 4:16 PM

## 2016-06-08 DIAGNOSIS — R410 Disorientation, unspecified: Secondary | ICD-10-CM | POA: Diagnosis not present

## 2016-06-08 DIAGNOSIS — G9340 Encephalopathy, unspecified: Secondary | ICD-10-CM | POA: Diagnosis not present

## 2016-06-08 LAB — URINE CULTURE: Culture: NO GROWTH

## 2016-06-08 LAB — GLUCOSE, CAPILLARY
Glucose-Capillary: 43 mg/dL — CL (ref 65–99)
Glucose-Capillary: 230 mg/dL — ABNORMAL HIGH (ref 65–99)

## 2016-06-08 LAB — HEMOGLOBIN A1C
Hgb A1c MFr Bld: 7.2 % — ABNORMAL HIGH (ref 4.8–5.6)
MEAN PLASMA GLUCOSE: 160 mg/dL

## 2016-06-08 NOTE — NC FL2 (Signed)
[place] MEDICAID FL2 LEVEL OF CARE SCREENING TOOL     IDENTIFICATION  Patient Name: James Pruitt Birthdate: [birth year] Sex: male Admission Date (Current Location): 06/06/2016  [place] and [place] Number:  Aaron Edelman [id number] L Facility and Address:  [facility]  [address] [place] [address]      Provider Number: [id number]  Attending Physician Name and Address:  Micael Hampshire Acost*  Relative Name and Phone Number:       Current Level of Care: Hospital Recommended Level of Care: Skilled Nursing Facility Prior Approval Number:    Date Approved/Denied:   PASRR Number: [id number]  Discharge Plan: SNF    Current Diagnoses: Patient Active Problem List   Diagnosis Date Noted  . Depression with anxiety 06/07/2016  . CKD (chronic kidney disease), stage III 06/07/2016  . Hypothyroidism 06/07/2016  . Acute encephalopathy 06/06/2016  . Insulin dependent diabetes mellitus (HCC) 02/26/2009  . HYPERLIPIDEMIA-MIXED 02/26/2009  . MYOCARDIAL INFARCTION, INFERIOR WALL 02/26/2009  . CORONARY ARTERY BYPASS GRAFT, FOUR VESSEL, HX OF 02/26/2009    Orientation RESPIRATION BLADDER Height & Weight     Self  Normal External catheter Weight: 180 lb (81.6 kg) Height:  5\' 11"  (180.3 cm)  BEHAVIORAL SYMPTOMS/MOOD NEUROLOGICAL BOWEL NUTRITION STATUS  Other (Comment) (none)  (n/a) Incontinent Diet (Heart healthy/carb modified)  AMBULATORY STATUS COMMUNICATION OF NEEDS Skin   Total Care Verbally Bruising                       Personal Care Assistance Level of Assistance  Bathing, Feeding, Dressing Bathing Assistance: Maximum assistance Feeding assistance: Maximum assistance Dressing Assistance: Maximum assistance     Functional Limitations Info  Sight, Hearing, Speech Sight Info: Adequate Hearing Info: Adequate Speech Info: Adequate    SPECIAL CARE FACTORS FREQUENCY                       Contractures      Additional Factors Info   Insulin Sliding Scale, Psychotropic Code Status Info: Full code Allergies Info: Iohexol Psychotropic Info: Ativan         Current Medications (06/08/2016):  This is the current hospital active medication list Current Facility-Administered Medications  Medication Dose Route Frequency Provider Last Rate Last Dose  . acetaminophen (TYLENOL) tablet 650 mg  650 mg Oral Q6H PRN Briscoe Deutscher, MD      . aspirin EC tablet 81 mg  81 mg Oral Daily Lavone Neri Opyd, MD      . atorvastatin (LIPITOR) tablet 40 mg  40 mg Oral QHS Briscoe Deutscher, MD   40 mg at 06/07/16 2101  . cholecalciferol (VITAMIN D) tablet 2,000 Units  2,000 Units Oral Daily Briscoe Deutscher, MD      . heparin injection 5,000 Units  5,000 Units Subcutaneous Q8H Briscoe Deutscher, MD   5,000 Units at 06/08/16 [phone]  . Influenza vac split quadrivalent PF (FLUARIX) injection 0.5 mL  0.5 mL Intramuscular Tomorrow-1000 Timothy S Opyd, MD      . insulin aspart (novoLOG) injection 0-9 Units  0-9 Units Subcutaneous TID WC Timothy S Opyd, MD      . insulin glargine (LANTUS) injection 10 Units  10 Units Subcutaneous QHS Briscoe Deutscher, MD   10 Units at 06/07/16 2100  . levothyroxine (SYNTHROID, LEVOTHROID) tablet 75 mcg  75 mcg Oral QHS Briscoe Deutscher, MD   75 mcg at 06/07/16 2101  . LORazepam (ATIVAN) tablet 0.5 mg  0.5  mg Oral Q8H PRN Briscoe Deutscherimothy S Opyd, MD   0.5 mg at 06/07/16 2101  . MEDLINE mouth rinse  15 mL Mouth Rinse BID Lavone Neriimothy S Opyd, MD   15 mL at 06/07/16 2200  . ondansetron (ZOFRAN) tablet 4 mg  4 mg Oral Q6H PRN Briscoe Deutscherimothy S Opyd, MD       Or  . ondansetron (ZOFRAN) injection 4 mg  4 mg Intravenous Q6H PRN Lavone Neriimothy S Opyd, MD      . pneumococcal 23 valent vaccine (PNU-IMMUNE) injection 0.5 mL  0.5 mL Intramuscular Tomorrow-1000 Briscoe Deutscherimothy S Opyd, MD      . senna-docusate (Senokot-S) tablet 1 tablet  1 tablet Oral BID Briscoe Deutscherimothy S Opyd, MD   1 tablet at 06/07/16 2101  . sertraline (ZOLOFT) tablet 50 mg  50 mg Oral QHS Briscoe Deutscherimothy S Opyd, MD   50 mg at  06/07/16 2101  . traMADol (ULTRAM) tablet 50 mg  50 mg Oral Q8H PRN Briscoe Deutscherimothy S Opyd, MD         Discharge Medications: Please see discharge summary for a list of discharge medications.  Relevant Imaging Results:  Relevant Lab Results:   Additional Information    Karn CassisStultz, Tuyen Uncapher [place], [place] [phone]

## 2016-06-08 NOTE — Progress Notes (Signed)
Inpatient Diabetes Program Recommendations  AACE/ADA: New Consensus Statement on Inpatient Glycemic Control (2015)  Target Ranges:  Prepandial:   less than 140 mg/dL      Peak postprandial:   less than 180 mg/dL (1-2 hours)      Critically ill patients:  140 - 180 mg/dL   Results for James Pruitt, Calder (MRN [id number]) as of 06/08/2016 10:43  Ref. Range 06/07/2016 08:19 06/07/2016 11:37 06/07/2016 20:55 06/08/2016 08:11  Glucose-Capillary Latest Ref Range: 65 - 99 mg/dL 96 409139 (H) 811137 (H) 43 (LL)   Review of Glycemic Control  Outpatient Diabetes medications: Lantus 10 units daily, Hmalog 2-10 units QID Current orders for Inpatient glycemic control: Lantus 10 units QHS, Novolog 0-9 units TID with meals  Inpatient Diabetes Program Recommendations: Insulin - Basal: Fasting glucose 43 mg/dl this morning. Please consider decreasing Lantus to 5 units QHS.  Thanks, Orlando PennerMarie Janat Tabbert, RN, MSN, CDE Diabetes Coordinator Inpatient Diabetes Program [phone] (Team Pager from 8am to 5pm) [phone] (AP office) [phone] [facility] office) [phone] [facility] office)

## 2016-06-08 NOTE — Progress Notes (Signed)
Attempted to call report to [facility] no answer at this time.  Will try back

## 2016-06-08 NOTE — Clinical Social Work Note (Signed)
Clinical Social Work Assessment  Patient Details  Name: James PaceMatthew Dansby MRN: [id number] Date of Birth: 10-Apr-1921  Date of referral:  06/08/16               Reason for consult:  Discharge Planning                Permission sought to share information with:    Permission granted to share information::     Name::        Agency::     Relationship::     Contact Information:     Housing/Transportation Living arrangements for the past 2 months:  Skilled Nursing Facility Source of Information:  Adult Children, Spouse Patient Interpreter Needed:  None Criminal Activity/Legal Involvement Pertinent to Current Situation/Hospitalization:  No - Comment as needed Significant Relationships:  Adult Children, Spouse Lives with:  Facility Resident Do you feel safe going back to the place where you live?  Yes Need for family participation in patient care:  Yes (Comment)  Care giving concerns:  None reported. [facility]    Social Worker assessment / plan:  CSW attempted to meet with pt at bedside. Pt oriented to self only. CSW spoke with pt's wife on phone. Pt has been resident at [facility] since January of this year. Family is very involved and supportive, visiting daily. He has two sons and a daughter-in-law in addition to his wife. Per Eunice Blaseebbie at facility, pt is nursing level of care and okay to return. D/C today. Discussed transportation options with pt's wife who defers to her son, Molly MaduroRobert. Robert requests transport via [facility].   Employment status:  Retired Health and safety inspectornsurance information:  Managed Care PT Recommendations:  Not assessed at this time Information / Referral to community resources:  Other (Comment Required) (Return to [facility])  Patient/Family's Response to care:  Pt's wife requests return to [facility] and is aware pt is medically stable today.   Patient/Family's Understanding of and Emotional Response to Diagnosis, Current Treatment, and Prognosis:  Pt's family appears  to be knowledgeable of pt's medical history.   Emotional Assessment Appearance:  Appears stated age Attitude/Demeanor/Rapport:  Unable to Assess Affect (typically observed):  Unable to Assess Orientation:  Oriented to Self Alcohol / Substance use:  Not Applicable Psych involvement (Current and /or in the community):  No (Comment)  Discharge Needs  Concerns to be addressed:  Discharge Planning Concerns Readmission within the last 30 days:  No Current discharge risk:  None Barriers to Discharge:  No Barriers Identified   Karn CassisStultz, Ashyia Schraeder [place], LCSW 06/08/2016, 1:26 PM [phone]

## 2016-06-08 NOTE — Discharge Summary (Signed)
Physician Discharge Summary  James Pruitt MVH:[id number] DOB: [birth year] DOA: 06/06/2016  PCP: Pearson Grippe, MD  Admit date: 06/06/2016 Discharge date: 06/08/2016  Time spent: 45 minutes  Recommendations for Outpatient Follow-up:  -Will be discharged back to SNF today.   Discharge Diagnoses:  Principal Problem:   Acute encephalopathy Active Problems:   Insulin dependent diabetes mellitus (HCC)   CORONARY ARTERY BYPASS GRAFT, FOUR VESSEL, HX OF   Depression with anxiety   CKD (chronic kidney disease), stage III   Hypothyroidism   Discharge Condition: Stable and improved  Filed Weights   06/06/16 1825  Weight: 81.6 kg (180 lb)    History of present illness:  As per Dr. Antionette Char on 10/8: James Pruitt is a 82 y.o. male with medical history significant for coronary artery disease status post CABG, dementia, depression, anxiety, insulin-dependent diabetes mellitus, and hypothyroidism who presents to the emergency department for evaluation of altered mental status. History is obtained through discussion with the SNF personnel, ED personnel, and reviewed electronic medical record. Patient had reportedly been in his usual state of health, alert and communicative, until 06/06/2016 when SNF staff noted him to be lethargic and confused. He was reportedly difficult to arouse and not communicating. This carried on throughout the day and he was sent in to the ED for evaluation. There have been no recent fevers, chills, or cough noted. There is been no recent change in the patient's medication and no apparent fall or trauma. Vitals have reportedly been stable. Patient is not known to use alcohol or illicit drugs.  ED Course: Upon arrival to the ED, patient is found to be afebrile, saturating well on room air, and with vital signs stable. EKG demonstrates a sinus rhythm with nonspecific interventricular conduction delay. Chest x-ray is negative for acute cardiopulmonary disease. Chemistry panel  features a BUN of 32 and serum creatinine 1.47 which appears consistent with his baseline. CMP is also notable for a new mild hyperbilirubinemia with total bilirubin 1.7. Ammonia is within the normal limits at 15 and ethanol is undetectable. CBC features a stable normocytic anemia with hemoglobin of 12.8. Urinalysis is obtained and not suggestive of infection. Urine was sent for culture. Head CT was negative for acute intracranial abnormality. Given the new hyperbilirubinemia and unclear etiology of the patient's presentation, CT of the abdomen and pelvis was also obtained, and also negative for acute pathology. Patient was given 1.5 L of normal saline in the emergency department. He remained hemodynamically stable and in no apparent respiratory distress, but continued to be somnolent and not verbally communicative. He'll be observed in the medical/surgical unit for ongoing evaluation and management of acute encephalopathy with unclear etiology.  Hospital Course:   Acute Encephalopathy -Etiology unclear. -Appears to be at his baseline level of function. -Head CT negative for acute findings, no acute neurologic deficit, no significant electrolyte derangement: No suggestion of infection, ammonia level within normal limits, alcohol level less than 5. -We'll be discharge back to SNF today.  Rest of chronic medical conditions have been stable his hospitalization.  Procedures:  None   Consultations:  None  Discharge Instructions  Discharge Instructions    Diet - low sodium heart healthy    Complete by:  As directed    Increase activity slowly    Complete by:  As directed        Medication List    STOP taking these medications   diphenhydrAMINE 25 MG tablet Commonly known as:  BENADRYL  TAKE these medications   acetaminophen 325 MG tablet Commonly known as:  TYLENOL Take 650 mg by mouth every 6 (six) hours as needed for mild pain, moderate pain or headache. What changed:  Another  medication with the same name was removed. Continue taking this medication, and follow the directions you see here.   aspirin EC 81 MG tablet Take 81 mg by mouth daily.   atorvastatin 40 MG tablet Commonly known as:  LIPITOR Take 40 mg by mouth at bedtime.   cholecalciferol 1000 units tablet Commonly known as:  VITAMIN D Take 2,000 Units by mouth daily.   HUMALOG KWIKPEN 100 UNIT/ML KiwkPen Generic drug:  insulin lispro Inject 2-10 Units into the skin 4 (four) times daily -  before meals and at bedtime. Pt uses per sliding scale:    150-200:  2 units  201-250:  4 units  251-300:  6 units  301-350:  8 units  351-400:  10 units Less than 60 or greater than 400 -- notify MD.   insulin glargine 100 unit/mL Sopn Commonly known as:  LANTUS Inject 10 Units into the skin daily.   levothyroxine 75 MCG tablet Commonly known as:  SYNTHROID, LEVOTHROID Take 75 mcg by mouth at bedtime.   LORazepam 0.5 MG tablet Commonly known as:  ATIVAN Take 0.5 mg by mouth every 8 (eight) hours as needed for anxiety.   senna-docusate 8.6-50 MG tablet Commonly known as:  Senokot-S Take 1 tablet by mouth 2 (two) times daily.   sertraline 50 MG tablet Commonly known as:  ZOLOFT Take 50 mg by mouth at bedtime.   traMADol 50 MG tablet Commonly known as:  ULTRAM Take 50 mg by mouth every 8 (eight) hours as needed for moderate pain.      Allergies  Allergen Reactions  . Iohexol Other (See Comments)    Reaction:  Pt passed out     Follow-up Information    Pearson Grippe, MD .   Specialty:  Internal Medicine Contact information: [address] [place] [address] [phone]            The results of significant diagnostics from this hospitalization (including imaging, microbiology, ancillary and laboratory) are listed below for reference.    Significant Diagnostic Studies: Ct Abdomen Pelvis Wo Contrast  Result Date: 06/06/2016 CLINICAL DATA:  Altered mental status and  right hip pain. Contrast allergy. EXAM: CT ABDOMEN AND PELVIS WITHOUT CONTRAST TECHNIQUE: Multidetector CT imaging of the abdomen and pelvis was performed following the standard protocol without IV contrast. COMPARISON:  10/27/2012 FINDINGS: Lower chest: Atelectasis in the lung bases. Coronary artery bypass surgery. Small esophageal hiatal hernia. Hepatobiliary: Unenhanced appearance is unremarkable. Pancreas: Fatty atrophy of the pancreas.  No inflammatory changes. Spleen: Unenhanced appearance is unremarkable. Adrenals/Urinary Tract: No adrenal gland nodules. Calcifications in the kidneys, likely representing vascular calcifications. There is a stone in the left renal pelvis measuring 6 mm in diameter. There is no hydronephrosis. Distal ureter is decompressed. No additional ureteral stones or bladder stones demonstrated. Bladder wall is not thickened. Sub cm cyst in the right kidney. Stomach/Bowel: Stomach, small bowel, and colon are decompressed, limiting evaluation. Scattered diverticula in the colon. Diverticulosis of the sigmoid colon. No changes to suggest diverticulitis. Appendix is normal. Vascular/Lymphatic: Calcification of the abdominal aorta and branch vessels. No aneurysm. No significant lymphadenopathy. Reproductive: Prostate gland is enlarged. Other: No abdominal wall hernia or abnormality. No abdominopelvic ascites. Musculoskeletal: Thoracolumbar scoliosis and degenerative changes. Degenerative changes in the hips. Motion  artifact limits evaluation but no evidence of fracture or dislocation of the right hip. Pelvis appears intact. IMPRESSION: No acute process demonstrated in the abdomen or pelvis. 6 mm stone in the left renal pelvis but without hydronephrosis. Aortic atherosclerosis with extensive calcification throughout the vasculature. Thoracolumbar scoliosis and degenerative changes. Electronically Signed   By: Burman NievesWilliam  Stevens M.D.   On: 06/06/2016 22:31   Dg Chest 1 View  Result Date:  06/06/2016 CLINICAL DATA:  Altered mental status. Increased confusion and slurred speech since yesterday. EXAM: CHEST 1 VIEW COMPARISON:  02/05/2012 FINDINGS: Changes from CABG surgery are stable. There is moderate enlargement of the cardiopericardial silhouette. No mediastinal or hilar masses or evidence of adenopathy. Clear lungs.  No pleural effusion or pneumothorax. Intra-articular bodies lie adjacent to the left glenohumeral joint, stable. IMPRESSION: 1. No acute cardiopulmonary disease. Stable appearance from the prior study. Electronically Signed   By: Amie Portlandavid  Ormond M.D.   On: 06/06/2016 19:36   Ct Head Wo Contrast  Result Date: 06/06/2016 CLINICAL DATA:  Increased confusion and slurred speech since yesterday. EXAM: CT HEAD WITHOUT CONTRAST TECHNIQUE: Contiguous axial images were obtained from the base of the skull through the vertex without intravenous contrast. COMPARISON:  01/02/2016 FINDINGS: Brain: There is ventricular and sulcal enlargement reflecting moderate to advanced atrophy, stable. No hydrocephalus. There are multiple old lacune infarcts most evident in the thalami and basal ganglia. There is extensive white matter hypoattenuation consistent with chronic microvascular ischemic change. Small old cortical infarcts are noted involving the occipital lobes with old cerebellar infarcts also noted. These findings are stable. There are no parenchymal masses or mass effect. There is no evidence of a recent cortical infarct. There are no extra-axial masses or abnormal fluid collections. There is no intracranial hemorrhage. Vascular: There is dilation of the basilar artery and prominence of the supraclinoid internal carotid arteries with significant calcifications. This is stable. No evidence of vessel thrombosis. Skull: Normal. Negative for fracture or focal lesion. Sinuses/Orbits: No acute finding. Other: None. IMPRESSION: 1. No acute intracranial abnormalities. 2. Atrophy, advanced chronic  microvascular ischemic change and multiple old infarcts, stable from the prior study. Electronically Signed   By: Amie Portlandavid  Ormond M.D.   On: 06/06/2016 19:35    Microbiology: Recent Results (from the past 240 hour(s))  Urine culture     Status: None   Collection Time: 06/06/16  7:52 PM  Result Value Ref Range Status   Specimen Description URINE, RANDOM  Final   Special Requests NONE  Final   Culture NO GROWTH [facility]   Final   Report Status 06/08/2016 FINAL  Final  MRSA PCR Screening     Status: None   Collection Time: 06/07/16  1:19 AM  Result Value Ref Range Status   MRSA by PCR NEGATIVE NEGATIVE Final    Comment:        The GeneXpert MRSA Assay (FDA approved for NASAL specimens only), is one component of a comprehensive MRSA colonization surveillance program. It is not intended to diagnose MRSA infection nor to guide or monitor treatment for MRSA infections.   MRSA PCR Screening     Status: None   Collection Time: 06/07/16  4:26 AM  Result Value Ref Range Status   MRSA by PCR NEGATIVE NEGATIVE Final    Comment:        The GeneXpert MRSA Assay (FDA approved for NASAL specimens only), is one component of a comprehensive MRSA colonization surveillance program. It is not intended to  diagnose MRSA infection nor to guide or monitor treatment for MRSA infections.      Labs: Basic Metabolic Panel:  Recent Labs Lab 06/06/16 1831 06/06/16 1848 06/07/16 0716  NA 136 139 138  K 4.2 4.1 3.9  CL 104 101 106  CO2 28  --  26  GLUCOSE 151* 148* 98  BUN 32* 29* 27*  CREATININE 1.47* 1.40* 1.25*  CALCIUM 9.6  --  9.0   Liver Function Tests:  Recent Labs Lab 06/06/16 1831  AST 23  ALT 22  ALKPHOS 75  BILITOT 1.7*  PROT 6.4*  ALBUMIN 3.7   No results for input(s): LIPASE, AMYLASE in the last 168 hours.  Recent Labs Lab 06/06/16 1957  AMMONIA 15   CBC:  Recent Labs Lab 06/06/16 1831 06/06/16 1848  WBC 7.0  --   NEUTROABS 4.0   --   HGB 12.8* 12.9*  HCT 37.9* 38.0*  MCV 92.7  --   PLT 177  --    Cardiac Enzymes: No results for input(s): CKTOTAL, CKMB, CKMBINDEX, TROPONINI in the last 168 hours. BNP: BNP (last 3 results) No results for input(s): BNP in the last 8760 hours.  ProBNP (last 3 results) No results for input(s): PROBNP in the last 8760 hours.  CBG:  Recent Labs Lab 06/07/16 0819 06/07/16 1137 06/07/16 2055 06/08/16 0811 06/08/16 1155  GLUCAP 96 139* 137* 43* 230*       Signed:  HERNANDEZ ACOSTA,ESTELA  [facility] Pager: [phone] 06/08/2016, 1:43 PM

## 2017-06-09 ENCOUNTER — Ambulatory Visit (INDEPENDENT_AMBULATORY_CARE_PROVIDER_SITE_OTHER): Payer: Medicare Other | Admitting: "Endocrinology

## 2017-06-09 ENCOUNTER — Encounter: Payer: Self-pay | Admitting: "Endocrinology

## 2017-06-09 VITALS — BP 119/75 | HR 69

## 2017-06-09 DIAGNOSIS — Z794 Long term (current) use of insulin: Secondary | ICD-10-CM | POA: Diagnosis not present

## 2017-06-09 DIAGNOSIS — E039 Hypothyroidism, unspecified: Secondary | ICD-10-CM

## 2017-06-09 DIAGNOSIS — N183 Chronic kidney disease, stage 3 unspecified: Secondary | ICD-10-CM

## 2017-06-09 DIAGNOSIS — E1122 Type 2 diabetes mellitus with diabetic chronic kidney disease: Secondary | ICD-10-CM | POA: Diagnosis not present

## 2017-06-09 DIAGNOSIS — E212 Other hyperparathyroidism: Secondary | ICD-10-CM

## 2017-06-09 MED ORDER — CALCITRIOL 0.25 MCG PO CAPS: 0.2500 ug | ORAL_CAPSULE | Freq: Two times a day (BID) | ORAL | 3 refills | Status: DC

## 2017-06-09 MED ORDER — CINACALCET HCL 30 MG PO TABS: 30.0000 mg | ORAL_TABLET | Freq: Every day | ORAL | 3 refills | Status: DC

## 2017-06-09 NOTE — Progress Notes (Signed)
Subjective:    Patient ID: James Pruitt, male    DOB: 1921-01-13, PCP Charlynne Pander, MD   Past Medical History:  Diagnosis Date  . BPH (benign prostatic hypertrophy)   . Cognitive communication deficit   . Coronary artery disease   . Coronary atherosclerosis of native coronary artery   . Dementia with behavioral problem   . Diabetes mellitus without complication (HCC)   . Dysphagia, oral phase   . Hyperlipemia   . Hyperlipidemia   . Kidney stones   . Muscle weakness (generalized)   . Osteoarthritis   . Personal history of fall   . Problems with hearing   . Thyroid disease   . Unsteady   . Vascular dementia with depressed mood   . Weakness generalized    Past Surgical History:  Procedure Laterality Date  . CORONARY ARTERY BYPASS GRAFT    . surgery for kidney stones    . TOTAL KNEE ARTHROPLASTY Left   . TRANSURETHRAL RESECTION OF PROSTATE     Social History   Social History  . Marital status: Married    Spouse name: N/A  . Number of children: N/A  . Years of education: N/A   Social History Main Topics  . Smoking status: Never Smoker  . Smokeless tobacco: Never Used  . Alcohol use No  . Drug use: No  . Sexual activity: Not Asked   Other Topics Concern  . None   Social History Narrative  . None   Outpatient Encounter Prescriptions as of 06/09/2017  Medication Sig  . acetaminophen (TYLENOL) 325 MG tablet Take 650 mg by mouth every 6 (six) hours as needed for mild pain, moderate pain or headache.  Marland Kitchen aspirin EC 81 MG tablet Take 81 mg by mouth daily.  Marland Kitchen atorvastatin (LIPITOR) 40 MG tablet Take 40 mg by mouth at bedtime.   . cholecalciferol (VITAMIN D) 1000 units tablet Take 2,000 Units by mouth daily.  . enalapril (VASOTEC) 2.5 MG tablet Take 2.5 mg by mouth daily.  . insulin glargine (LANTUS) 100 unit/mL SOPN Inject 18 Units into the skin daily.   . insulin lispro (HUMALOG KWIKPEN) 100 UNIT/ML KiwkPen Inject 2-10 Units into the skin 4 (four) times daily -   before meals and at bedtime. Pt uses per sliding scale:    150-200:  2 units  201-250:  4 units  251-300:  6 units  301-350:  8 units  351-400:  10 units Less than 60 or greater than 400 -- notify MD.  . levothyroxine (SYNTHROID, LEVOTHROID) 75 MCG tablet Take 75 mcg by mouth at bedtime.  . senna-docusate (SENOKOT-S) 8.6-50 MG tablet Take 1 tablet by mouth 2 (two) times daily.   . sertraline (ZOLOFT) 50 MG tablet Take 50 mg by mouth at bedtime.  . calcitRIOL (ROCALTROL) 0.25 MCG capsule Take 1 capsule (0.25 mcg total) by mouth 2 (two) times daily.  . cinacalcet (SENSIPAR) 30 MG tablet Take 1 tablet (30 mg total) by mouth daily with breakfast.  . [DISCONTINUED] LORazepam (ATIVAN) 0.5 MG tablet Take 0.5 mg by mouth every 8 (eight) hours as needed for anxiety.  . [DISCONTINUED] traMADol (ULTRAM) 50 MG tablet Take 50 mg by mouth every 8 (eight) hours as needed for moderate pain.    No facility-administered encounter medications on file as of 06/09/2017.    ALLERGIES: Allergies  Allergen Reactions  . Iohexol Other (See Comments)    Reaction:  Pt passed out      VACCINATION STATUS: There  is no immunization history for the selected administration types on file for this patient.  HPI James Pruitt is 83 y.o. male who presents today with  Multiple medical problems as above. he is being seen in consultation foelevated PTH requested by Charlynne Pander, MD. - He is a nursing home resident due to cognitive and current medication deficits. He is accompanied by his caretaker who does not know the details of his medical history.  - On a routine blood work was found to have PTH of 100 (normal 15-64).  - There is no documented history of parathyroid dysfunction in his medical history.  - He does have type 2 diabetes, complicated by stage 2-3 renal insufficiency. - He is not a good historian. He does not know much of his family history. - He is not on any calcium supplements. - He is wheelchair-bound  due to disequilibrium and deconditioning.  Review of Systems  Constitutional: + weight gain,  no fatigue, no subjective hyperthermia, no subjective hypothermia Eyes: no blurry vision, no xerophthalmia ENT: no sore throat, no nodules palpated in throat, no dysphagia/odynophagia, no hoarseness Cardiovascular: no Chest Pain, no Shortness of Breath, no palpitations, no leg swelling Respiratory: no cough, no SOB Gastrointestinal: no Nausea/Vomiting/Diarhhea Musculoskeletal: no muscle/joint aches, + disequilibrium  Skin: no rashes Neurological: no tremors, no numbness, no tingling, no dizziness Psychiatric: no depression, no anxiety  Objective:    BP 119/75   Pulse 69   Wt Readings from Last 3 Encounters:  06/06/16 180 lb (81.6 kg)  04/09/16 180 lb (81.6 kg)  10/26/15 165 lb (74.8 kg)    Physical Exam  Constitutional: + Wheelchair bound, no acute distress,  normal state of mind Eyes: PERRLA, EOMI, no exophthalmos ENT: moist mucous membranes, no thyromegaly, no cervical lymphadenopathy Cardiovascular: normal precordial activity, Regular Rate and Rhythm, no Murmur/Rubs/Gallops Respiratory:  adequate breathing efforts, no gross chest deformity, Clear to auscultation bilaterally Gastrointestinal: abdomen soft, Non -tender, No distension, Bowel Sounds present Musculoskeletal: no gross deformities, strength intact in all four extremities Skin: moist, warm, no rashes Neurological: no tremor with outstretched hands, Deep tendon reflexes normal in all four extremities.  CMP ( most recent) CMP     Component Value Date/Time   NA 138 06/07/2016 0716   K 3.9 06/07/2016 0716   CL 106 06/07/2016 0716   CO2 26 06/07/2016 0716   GLUCOSE 98 06/07/2016 0716   BUN 27 (H) 06/07/2016 0716   CREATININE 1.25 (H) 06/07/2016 0716   CALCIUM 9.0 06/07/2016 0716   PROT 6.4 (L) 06/06/2016 1831   ALBUMIN 3.7 06/06/2016 1831   AST 23 06/06/2016 1831   ALT 22 06/06/2016 1831   ALKPHOS 75 06/06/2016 1831    BILITOT 1.7 (H) 06/06/2016 1831   GFRNONAA 48 (L) 06/07/2016 0716   GFRAA 55 (L) 06/07/2016 0716     Diabetic Labs (most recent): Lab Results  Component Value Date   HGBA1C 7.2 (H) 06/07/2016   05/12/2017 labs show PTH 100.7     Assessment & Plan:   1. Other hyperparathyroidism (HCC) - Donta Fuster  is being seen at a kind request of Charlynne Pander, MD. - I have reviewed his available  records and clinically evaluated the patient. - Based on my reviews, he has  secondary hyperparathyroidism due to renal insufficiency.  - He would benefit from low-dose Sensipar as well as calcitriol. - I have initiated Sensipar 30 minute grams by mouth daily and calcitriol 0.25 g by mouth twice a day. -he will return in  12 week to review his repeat labs. - PTH, intact and calcium - COMPLETE METABOLIC PANEL WITH GFR  2. Hypothyroidism, unspecified type - He is on levothyroxine 75 g by mouth every morning. He does not have recent thyroid function tests.  - We discussed about correct intake of levothyroxine, at fasting, with water, separated by at least 30 minutes from breakfast, and separated by more than 4 hours from calcium, iron, multivitamins, acid reflux medications (PPIs). -Patient is made aware of the fact that thyroid hormone replacement is needed for life, dose to be adjusted by periodic monitoring of thyroid function tests.   3. Type 2 diabetes mellitus with stage 3 chronic kidney disease, with long-term current use of insulin (HCC) - No recent A1c. - He is on Lantus 18 units nightly and Humalog to-10 units 3 times a day before meals.  - Time spent with the patient: 45 minutes, of which >50% was spent in obtaining information about his symptoms, reviewing his previous labs, evaluations, and treatments, counseling him about his  secondary hyperparathyroidism, and developing a plan for long term treatment.  - I advised patient to maintain close follow up with Charlynne Pander, MD for primary  care needs.  Follow up plan: Return in about 3 months (around 09/09/2017) for follow up with pre-visit labs.  Marquis Lunch, MD [facility] Phone: [phone]  Fax: [phone]   06/09/2017, 1:21 PM This note was partially dictated with voice recognition software. Similar sounding words can be transcribed inadequately or may not  be corrected upon review.

## 2017-09-14 ENCOUNTER — Ambulatory Visit (INDEPENDENT_AMBULATORY_CARE_PROVIDER_SITE_OTHER): Payer: Medicare Other | Admitting: "Endocrinology

## 2017-09-14 ENCOUNTER — Encounter: Payer: Self-pay | Admitting: "Endocrinology

## 2017-09-14 VITALS — BP 138/73 | HR 54

## 2017-09-14 DIAGNOSIS — E212 Other hyperparathyroidism: Secondary | ICD-10-CM

## 2017-09-14 DIAGNOSIS — E039 Hypothyroidism, unspecified: Secondary | ICD-10-CM

## 2017-09-14 NOTE — Progress Notes (Signed)
Subjective:    Patient ID: James Pruitt, male    DOB: 1921/04/04, PCP Charlynne Pander, MD   Past Medical History:  Diagnosis Date  . BPH (benign prostatic hypertrophy)   . Cognitive communication deficit   . Coronary artery disease   . Coronary atherosclerosis of native coronary artery   . Dementia with behavioral problem   . Diabetes mellitus without complication (HCC)   . Dysphagia, oral phase   . Hyperlipemia   . Hyperlipidemia   . Kidney stones   . Muscle weakness (generalized)   . Osteoarthritis   . Personal history of fall   . Problems with hearing   . Thyroid disease   . Unsteady   . Vascular dementia with depressed mood   . Weakness generalized    Past Surgical History:  Procedure Laterality Date  . CORONARY ARTERY BYPASS GRAFT    . surgery for kidney stones    . TOTAL KNEE ARTHROPLASTY Left   . TRANSURETHRAL RESECTION OF PROSTATE     Social History   Socioeconomic History  . Marital status: Married    Spouse name: None  . Number of children: None  . Years of education: None  . Highest education level: None  Social Needs  . Financial resource strain: None  . Food insecurity - worry: None  . Food insecurity - inability: None  . Transportation needs - medical: None  . Transportation needs - non-medical: None  Occupational History  . None  Tobacco Use  . Smoking status: Never Smoker  . Smokeless tobacco: Never Used  Substance and Sexual Activity  . Alcohol use: No  . Drug use: No  . Sexual activity: None  Other Topics Concern  . None  Social History Narrative  . None   Outpatient Encounter Medications as of 09/14/2017  Medication Sig  . acetaminophen (TYLENOL) 325 MG tablet Take 650 mg by mouth every 6 (six) hours as needed for mild pain, moderate pain or headache.  Marland Kitchen aspirin EC 81 MG tablet Take 81 mg by mouth daily.  Marland Kitchen atorvastatin (LIPITOR) 40 MG tablet Take 40 mg by mouth at bedtime.   . calcitRIOL (ROCALTROL) 0.25 MCG capsule Take 1  capsule (0.25 mcg total) by mouth 2 (two) times daily.  . cholecalciferol (VITAMIN D) 1000 units tablet Take 2,000 Units by mouth daily.  . cinacalcet (SENSIPAR) 30 MG tablet Take 1 tablet (30 mg total) by mouth daily with breakfast.  . enalapril (VASOTEC) 2.5 MG tablet Take 2.5 mg by mouth daily.  . insulin glargine (LANTUS) 100 unit/mL SOPN Inject 18 Units into the skin daily.   . insulin lispro (HUMALOG KWIKPEN) 100 UNIT/ML KiwkPen Inject 2-10 Units into the skin 4 (four) times daily -  before meals and at bedtime. Pt uses per sliding scale:    150-200:  2 units  201-250:  4 units  251-300:  6 units  301-350:  8 units  351-400:  10 units Less than 60 or greater than 400 -- notify MD.  . levothyroxine (SYNTHROID, LEVOTHROID) 75 MCG tablet Take 75 mcg by mouth at bedtime.  . senna-docusate (SENOKOT-S) 8.6-50 MG tablet Take 1 tablet by mouth 2 (two) times daily.   . sertraline (ZOLOFT) 50 MG tablet Take 50 mg by mouth at bedtime.   No facility-administered encounter medications on file as of 09/14/2017.    ALLERGIES: Allergies  Allergen Reactions  . Iohexol Other (See Comments)    Reaction:  Pt passed out  VACCINATION STATUS: There is no immunization history for the selected administration types on file for this patient.  HPI James PaceMatthew Pruitt is 84 y.o. male who presents today with  Multiple medical problems as above. he is being seen in follow-up for secondary hyperparathyroidism related to CK D.  - He was initiated on low-dose Sensipar 30 minute grams by mouth daily along with calcitriol 0.25 g by mouth twice a day during his last visit.  - He is returning for follow-up accompanied by employee of the nursing home he is staying, not his caretaker , this person does not know the details of his medical history.   - He is a nursing home resident due to cognitive  deficit.  - His recent labs show control calcium 9.3, however skipped the PTH ordered during his last visit.  - He  does have type 2 diabetes, complicated by stage 2-3 renal insufficiency. - He is not a good historian. He does not know much of his family history. - He is not on any calcium supplements. - He is wheelchair-bound due to disequilibrium and deconditioning.  Review of Systems  Constitutional: + steady weight since last visit,  no fatigue, no subjective hyperthermia, no subjective hypothermia Eyes: no blurry vision, no xerophthalmia ENT: no sore throat, no nodules palpated in throat, no dysphagia/odynophagia, no hoarseness Cardiovascular: no Chest Pain, no Shortness of Breath, no palpitations, no leg swelling Respiratory: no cough, no SOB Gastrointestinal: no Nausea/Vomiting/Diarhhea Musculoskeletal: Wheelchair-bound,  + disequilibrium  Skin: no rashes Neurological: no tremors, no numbness, no tingling, no dizziness Psychiatric: no depression, no anxiety  Objective:    BP 138/73   Pulse (!) 54   Wt Readings from Last 3 Encounters:  06/06/16 180 lb (81.6 kg)  04/09/16 180 lb (81.6 kg)  10/26/15 165 lb (74.8 kg)    Physical Exam  Constitutional: + Wheelchair bound,  no acute distress,   normal state of mind Eyes: PERRLA, EOMI, no exophthalmos ENT: moist mucous membranes, no thyromegaly, no cervical lymphadenopathy Cardiovascular: normal precordial activity, Regular Rate and Rhythm, no Murmur/Rubs/Gallops Respiratory:  adequate breathing efforts, no gross chest deformity, Clear to auscultation bilaterally Gastrointestinal: abdomen soft, Non -tender, No distension, Bowel Sounds present Musculoskeletal: no gross deformities, strength intact in all four extremities Skin: moist, warm, no rashes Neurological: no tremor with outstretched hands, Deep tendon reflexes normal in all four extremities.  CMP ( most recent) CMP     Component Value Date/Time   NA 138 06/07/2016 0716   K 3.9 06/07/2016 0716   CL 106 06/07/2016 0716   CO2 26 06/07/2016 0716   GLUCOSE 98 06/07/2016 0716   BUN  27 (H) 06/07/2016 0716   CREATININE 1.25 (H) 06/07/2016 0716   CALCIUM 9.0 06/07/2016 0716   PROT 6.4 (L) 06/06/2016 1831   ALBUMIN 3.7 06/06/2016 1831   AST 23 06/06/2016 1831   ALT 22 06/06/2016 1831   ALKPHOS 75 06/06/2016 1831   BILITOT 1.7 (H) 06/06/2016 1831   GFRNONAA 48 (L) 06/07/2016 0716   GFRAA 55 (L) 06/07/2016 0716     Diabetic Labs (most recent): Lab Results  Component Value Date   HGBA1C 7.2 (H) 06/07/2016   05/12/2017 labs show PTH 100.7     Assessment & Plan:   1. Other hyperparathyroidism (HCC) -  his previsit labs are not complete. However he appears to have benefited from low-dose Sensipar. I suggested for him to continue Sensipar 30 mg by mouth twice a day with breakfast and low-dose calcitriol at 0.25  g by mouth twice a day.    2. Hypothyroidism, unspecified type - He is on levothyroxine 75 mg by mouth every morning. His recent labs also did not include thyroid function tests.   - I advised for him to continue levothyroxine 75 g by mouth every morning.   - We discussed about correct intake of levothyroxine, at fasting, with water, separated by at least 30 minutes from breakfast, and separated by more than 4 hours from calcium, iron, multivitamins, acid reflux medications (PPIs). -Patient is made aware of the fact that thyroid hormone replacement is needed for life, dose to be adjusted by periodic monitoring of thyroid function tests.   3. Type 2 diabetes mellitus with stage 3 chronic kidney disease, with long-term current use of insulin (HCC) - No recent A1c. - He is on Lantus 18 units nightly and Humalog to 2-10 units 3 times a day before meals. His recent A1c is 7.8%. I did not make any adjustment on this point.    - I advised patient to maintain close follow up with Charlynne Pander, MD for primary care needs.  Follow up plan: Return in about 6 months (around 03/14/2018) for follow up with pre-visit labs.  Marquis Lunch, MD [facility] Phone: [phone]  Fax: [phone]   09/14/2017, 2:40 PM This note was partially dictated with voice recognition software. Similar sounding words can be transcribed inadequately or may not  be corrected upon review.

## 2018-01-05 ENCOUNTER — Encounter: Payer: Self-pay | Admitting: "Endocrinology

## 2018-01-05 LAB — LIPID PANEL
Cholesterol: 120 (ref 0–200)
HDL: 40 (ref 35–70)
LDL CALC: 59
Triglycerides: 104 (ref 40–160)

## 2018-01-05 LAB — BASIC METABOLIC PANEL
BUN: 35 — AB (ref 4–21)
Creatinine: 1.2 (ref 0.6–1.3)

## 2018-01-05 LAB — HEMOGLOBIN A1C: HEMOGLOBIN A1C: 6.6

## 2018-01-05 LAB — TSH: TSH: 1.3 (ref 0.41–5.90)

## 2018-03-14 ENCOUNTER — Ambulatory Visit (INDEPENDENT_AMBULATORY_CARE_PROVIDER_SITE_OTHER): Payer: Medicare Other | Admitting: "Endocrinology

## 2018-03-14 ENCOUNTER — Encounter: Payer: Self-pay | Admitting: "Endocrinology

## 2018-03-14 VITALS — HR 70 | Resp 12

## 2018-03-14 DIAGNOSIS — E212 Other hyperparathyroidism: Secondary | ICD-10-CM | POA: Diagnosis not present

## 2018-03-14 DIAGNOSIS — E039 Hypothyroidism, unspecified: Secondary | ICD-10-CM | POA: Diagnosis not present

## 2018-03-14 NOTE — Progress Notes (Signed)
Subjective:    Patient ID: James Pruitt, male    DOB: 12-02-1920, PCP Charlynne Pander, MD   Past Medical History:  Diagnosis Date  . BPH (benign prostatic hypertrophy)   . Cognitive communication deficit   . Coronary artery disease   . Coronary atherosclerosis of native coronary artery   . Dementia with behavioral problem   . Diabetes mellitus without complication (HCC)   . Dysphagia, oral phase   . Hyperlipemia   . Hyperlipidemia   . Kidney stones   . Muscle weakness (generalized)   . Osteoarthritis   . Personal history of fall   . Problems with hearing   . Thyroid disease   . Unsteady   . Vascular dementia with depressed mood   . Weakness generalized    Past Surgical History:  Procedure Laterality Date  . CORONARY ARTERY BYPASS GRAFT    . surgery for kidney stones    . TOTAL KNEE ARTHROPLASTY Left   . TRANSURETHRAL RESECTION OF PROSTATE     Social History   Socioeconomic History  . Marital status: Married    Spouse name: Not on file  . Number of children: Not on file  . Years of education: Not on file  . Highest education level: Not on file  Occupational History  . Not on file  Social Needs  . Financial resource strain: Not on file  . Food insecurity:    Worry: Not on file    Inability: Not on file  . Transportation needs:    Medical: Not on file    Non-medical: Not on file  Tobacco Use  . Smoking status: Never Smoker  . Smokeless tobacco: Never Used  Substance and Sexual Activity  . Alcohol use: No  . Drug use: No  . Sexual activity: Not on file  Lifestyle  . Physical activity:    Days per week: Not on file    Minutes per session: Not on file  . Stress: Not on file  Relationships  . Social connections:    Talks on phone: Not on file    Gets together: Not on file    Attends religious service: Not on file    Active member of club or organization: Not on file    Attends meetings of clubs or organizations: Not on file    Relationship status: Not  on file  Other Topics Concern  . Not on file  Social History Narrative  . Not on file   Outpatient Encounter Medications as of 03/14/2018  Medication Sig  . acetaminophen (TYLENOL) 325 MG tablet Take 650 mg by mouth every 6 (six) hours as needed for mild pain, moderate pain or headache.  Marland Kitchen aspirin EC 81 MG tablet Take 81 mg by mouth daily.  Marland Kitchen atorvastatin (LIPITOR) 40 MG tablet Take 40 mg by mouth at bedtime.   . calcitRIOL (ROCALTROL) 0.25 MCG capsule Take 1 capsule (0.25 mcg total) by mouth 2 (two) times daily.  . cholecalciferol (VITAMIN D) 1000 units tablet Take 2,000 Units by mouth daily.  . cinacalcet (SENSIPAR) 30 MG tablet Take 1 tablet (30 mg total) by mouth daily with breakfast.  . enalapril (VASOTEC) 2.5 MG tablet Take 2.5 mg by mouth daily.  . insulin glargine (LANTUS) 100 unit/mL SOPN Inject 18 Units into the skin daily.   . insulin lispro (HUMALOG KWIKPEN) 100 UNIT/ML KiwkPen Inject 2-10 Units into the skin 4 (four) times daily -  before meals and at bedtime. Pt uses per sliding scale:  150-200:  2 units  201-250:  4 units  251-300:  6 units  301-350:  8 units  351-400:  10 units Less than 60 or greater than 400 -- notify MD.  . levothyroxine (SYNTHROID, LEVOTHROID) 75 MCG tablet Take 75 mcg by mouth at bedtime.  . senna-docusate (SENOKOT-S) 8.6-50 MG tablet Take 1 tablet by mouth 2 (two) times daily.   . sertraline (ZOLOFT) 50 MG tablet Take 50 mg by mouth at bedtime.   No facility-administered encounter medications on file as of 03/14/2018.    ALLERGIES: Allergies  Allergen Reactions  . Iohexol Other (See Comments)    Reaction:  Pt passed out      VACCINATION STATUS: There is no immunization history for the selected administration types on file for this patient.  HPI James PaceMatthew Pruitt is 84 y.o. male who presents today with  Multiple medical problems as above. he is being seen in follow-up for secondary hyperparathyroidism related to CKD.  - He was initiated  on low-dose Sensipar 30 minute grams by mouth daily along with calcitriol 0.25 g by mouth twice a day during his last visit.  - He is returning for follow-up accompanied by employee of the nursing home he is staying, not his caretaker , this person does not know the details of his medical history.   - He is a nursing home resident due to cognitive  deficit.  - His recent labs show control calcium 9.3, however skipped the PTH ordered during his last visit.  - He does have type 2 diabetes, complicated by stage 2-3 renal insufficiency. - He is not a good historian. He does not know much of his family history. - He is not on any calcium supplements. - He is wheelchair-bound due to disequilibrium and deconditioning.  Review of Systems -Difficult to elicit review of systems on this patient. Constitutional: + steady weight. Eyes: no blurry vision, no xerophthalmia ENT: no sore throat, no nodules palpated in throat, no dysphagia/odynophagia, no hoarseness Musculoskeletal: Wheelchair-bound,  + disequilibrium  Skin: no rashes Neurological: no tremors, no numbness, no tingling, no dizziness Psychiatric: no depression, no anxiety  Objective:    Pulse 70   Resp 12   Wt Readings from Last 3 Encounters:  06/06/16 180 lb (81.6 kg)  04/09/16 180 lb (81.6 kg)  10/26/15 165 lb (74.8 kg)    Physical Exam  Constitutional: + Wheelchair bound,  no acute distress,   normal state of mind Eyes: PERRLA, EOMI, no exophthalmos ENT: moist mucous membranes, no thyromegaly, no cervical lymphadenopathy Musculoskeletal: no gross deformities, strength intact in all four extremities Skin: moist, warm, no rashes Neurological: no tremor with outstretched hands  CMP ( most recent) CMP     Component Value Date/Time   NA 138 06/07/2016 0716   K 3.9 06/07/2016 0716   CL 106 06/07/2016 0716   CO2 26 06/07/2016 0716   GLUCOSE 98 06/07/2016 0716   BUN 35 (A) 01/05/2018   CREATININE 1.2 01/05/2018   CREATININE  1.25 (H) 06/07/2016 0716   CALCIUM 9.0 06/07/2016 0716   PROT 6.4 (L) 06/06/2016 1831   ALBUMIN 3.7 06/06/2016 1831   AST 23 06/06/2016 1831   ALT 22 06/06/2016 1831   ALKPHOS 75 06/06/2016 1831   BILITOT 1.7 (H) 06/06/2016 1831   GFRNONAA 48 (L) 06/07/2016 0716   GFRAA 55 (L) 06/07/2016 0716     Diabetic Labs (most recent): Lab Results  Component Value Date   HGBA1C 6.6 01/05/2018   HGBA1C 7.2 (H)  06/07/2016   05/12/2017 labs show PTH 100.7  Jan 05, 2018 labs show calcium control at 9.3, BUN 30, creatinine 1.1 6, GFR 53.  Assessment & Plan:   1. hyperparathyroidism (HCC) -Likely secondary hyperparathyroidism due to CKD. -  his previsit labs are not complete. However he appears to have responded and benefited from low-dose Sensipar therapy.  -  I suggested for him to continue Sensipar 30 mg by mouth once a day with breakfast and continue low-dose calcitriol 0.25 mcg p.o. twice daily with meals.     2. Hypothyroidism, unspecified type -He is on levothyroxine 75 mcg p.o. before breakfast. His TSH is on target.     - We discussed about correct intake of levothyroxine, at fasting, with water, separated by at least 30 minutes from breakfast, and separated by more than 4 hours from calcium, iron, multivitamins, acid reflux medications (PPIs). -Patient is made aware of the fact that thyroid hormone replacement is needed for life, dose to be adjusted by periodic monitoring of thyroid function tests.  3. Type 2 diabetes mellitus with stage 3 chronic kidney disease, with long-term current use of insulin (HCC) -His recent labs show A1c of 6.6%.    - He is on Lantus 18 units nightly and Humalog to 2-10 units 3 times a day before meals.  His diabetes is being managed by his PMD. - I advised patient to maintain close follow up with Charlynne Pander, MD for primary care needs.  Follow up plan: Return in about 6 months (around 09/14/2018) for follow up with pre-visit labs.  Marquis Lunch,  MD [facility] Phone: [phone]  Fax: [phone]   03/14/2018, 3:15 PM This note was partially dictated with voice recognition software. Similar sounding words can be transcribed inadequately or may not  be corrected upon review.

## 2018-07-08 ENCOUNTER — Other Ambulatory Visit: Payer: Self-pay

## 2018-07-08 ENCOUNTER — Inpatient Hospital Stay (HOSPITAL_COMMUNITY)
Admission: EM | Admit: 2018-07-08 | Discharge: 2018-07-12 | DRG: 638 | Disposition: A | Discharge status: Skilled Nursing Facility | Payer: Medicare Other | Source: Skilled Nursing Facility | Attending: Family Medicine | Admitting: Family Medicine

## 2018-07-08 ENCOUNTER — Encounter (HOSPITAL_COMMUNITY): Payer: Self-pay | Admitting: Emergency Medicine

## 2018-07-08 DIAGNOSIS — N400 Enlarged prostate without lower urinary tract symptoms: Secondary | ICD-10-CM | POA: Diagnosis present

## 2018-07-08 DIAGNOSIS — F0153 Vascular dementia, unspecified severity, with mood disturbance: Secondary | ICD-10-CM | POA: Diagnosis present

## 2018-07-08 DIAGNOSIS — R638 Other symptoms and signs concerning food and fluid intake: Secondary | ICD-10-CM | POA: Diagnosis not present

## 2018-07-08 DIAGNOSIS — E861 Hypovolemia: Secondary | ICD-10-CM | POA: Diagnosis present

## 2018-07-08 DIAGNOSIS — N179 Acute kidney failure, unspecified: Secondary | ICD-10-CM | POA: Diagnosis present

## 2018-07-08 DIAGNOSIS — R17 Unspecified jaundice: Secondary | ICD-10-CM | POA: Diagnosis present

## 2018-07-08 DIAGNOSIS — E039 Hypothyroidism, unspecified: Secondary | ICD-10-CM | POA: Diagnosis present

## 2018-07-08 DIAGNOSIS — F0151 Vascular dementia with behavioral disturbance: Secondary | ICD-10-CM | POA: Diagnosis present

## 2018-07-08 DIAGNOSIS — E1100 Type 2 diabetes mellitus with hyperosmolarity without nonketotic hyperglycemic-hyperosmolar coma (NKHHC): Principal | ICD-10-CM | POA: Diagnosis present

## 2018-07-08 DIAGNOSIS — R627 Adult failure to thrive: Secondary | ICD-10-CM | POA: Diagnosis present

## 2018-07-08 DIAGNOSIS — E782 Mixed hyperlipidemia: Secondary | ICD-10-CM | POA: Diagnosis present

## 2018-07-08 DIAGNOSIS — E870 Hyperosmolality and hypernatremia: Secondary | ICD-10-CM | POA: Diagnosis not present

## 2018-07-08 DIAGNOSIS — R4182 Altered mental status, unspecified: Secondary | ICD-10-CM | POA: Diagnosis present

## 2018-07-08 DIAGNOSIS — D696 Thrombocytopenia, unspecified: Secondary | ICD-10-CM | POA: Diagnosis present

## 2018-07-08 DIAGNOSIS — N183 Chronic kidney disease, stage 3 unspecified: Secondary | ICD-10-CM | POA: Diagnosis present

## 2018-07-08 DIAGNOSIS — I2510 Atherosclerotic heart disease of native coronary artery without angina pectoris: Secondary | ICD-10-CM | POA: Diagnosis present

## 2018-07-08 DIAGNOSIS — Z7189 Other specified counseling: Secondary | ICD-10-CM

## 2018-07-08 DIAGNOSIS — Z66 Do not resuscitate: Secondary | ICD-10-CM | POA: Diagnosis present

## 2018-07-08 DIAGNOSIS — D649 Anemia, unspecified: Secondary | ICD-10-CM | POA: Diagnosis present

## 2018-07-08 DIAGNOSIS — F329 Major depressive disorder, single episode, unspecified: Secondary | ICD-10-CM

## 2018-07-08 DIAGNOSIS — Z91041 Radiographic dye allergy status: Secondary | ICD-10-CM

## 2018-07-08 DIAGNOSIS — Z951 Presence of aortocoronary bypass graft: Secondary | ICD-10-CM

## 2018-07-08 DIAGNOSIS — R1311 Dysphagia, oral phase: Secondary | ICD-10-CM | POA: Diagnosis present

## 2018-07-08 DIAGNOSIS — F418 Other specified anxiety disorders: Secondary | ICD-10-CM | POA: Diagnosis present

## 2018-07-08 DIAGNOSIS — Z9181 History of falling: Secondary | ICD-10-CM

## 2018-07-08 DIAGNOSIS — Z515 Encounter for palliative care: Secondary | ICD-10-CM | POA: Diagnosis not present

## 2018-07-08 DIAGNOSIS — B3749 Other urogenital candidiasis: Secondary | ICD-10-CM | POA: Diagnosis present

## 2018-07-08 DIAGNOSIS — I129 Hypertensive chronic kidney disease with stage 1 through stage 4 chronic kidney disease, or unspecified chronic kidney disease: Secondary | ICD-10-CM | POA: Diagnosis present

## 2018-07-08 DIAGNOSIS — E1122 Type 2 diabetes mellitus with diabetic chronic kidney disease: Secondary | ICD-10-CM | POA: Diagnosis present

## 2018-07-08 DIAGNOSIS — Z96652 Presence of left artificial knee joint: Secondary | ICD-10-CM | POA: Diagnosis present

## 2018-07-08 DIAGNOSIS — E860 Dehydration: Secondary | ICD-10-CM | POA: Diagnosis present

## 2018-07-08 DIAGNOSIS — R64 Cachexia: Secondary | ICD-10-CM | POA: Diagnosis present

## 2018-07-08 DIAGNOSIS — Z9079 Acquired absence of other genital organ(s): Secondary | ICD-10-CM

## 2018-07-08 DIAGNOSIS — Z6820 Body mass index (BMI) 20.0-20.9, adult: Secondary | ICD-10-CM

## 2018-07-08 DIAGNOSIS — F0150 Vascular dementia without behavioral disturbance: Secondary | ICD-10-CM

## 2018-07-08 DIAGNOSIS — Z79890 Hormone replacement therapy (postmenopausal): Secondary | ICD-10-CM

## 2018-07-08 DIAGNOSIS — Z87442 Personal history of urinary calculi: Secondary | ICD-10-CM

## 2018-07-08 DIAGNOSIS — Z79899 Other long term (current) drug therapy: Secondary | ICD-10-CM

## 2018-07-08 DIAGNOSIS — I252 Old myocardial infarction: Secondary | ICD-10-CM | POA: Diagnosis not present

## 2018-07-08 DIAGNOSIS — Z794 Long term (current) use of insulin: Secondary | ICD-10-CM

## 2018-07-08 DIAGNOSIS — H9190 Unspecified hearing loss, unspecified ear: Secondary | ICD-10-CM | POA: Diagnosis present

## 2018-07-08 DIAGNOSIS — Z7982 Long term (current) use of aspirin: Secondary | ICD-10-CM

## 2018-07-08 DIAGNOSIS — R739 Hyperglycemia, unspecified: Secondary | ICD-10-CM

## 2018-07-08 LAB — COMPREHENSIVE METABOLIC PANEL
ALT: 83 U/L — ABNORMAL HIGH (ref 0–44)
ANION GAP: 7 (ref 5–15)
AST: 20 U/L (ref 15–41)
Albumin: 3.7 g/dL (ref 3.5–5.0)
Alkaline Phosphatase: 90 U/L (ref 38–126)
BUN: 96 mg/dL — ABNORMAL HIGH (ref 8–23)
CALCIUM: 11.2 mg/dL — AB (ref 8.9–10.3)
CHLORIDE: 129 mmol/L — AB (ref 98–111)
CO2: 28 mmol/L (ref 22–32)
Creatinine, Ser: 2.48 mg/dL — ABNORMAL HIGH (ref 0.61–1.24)
GFR calc Af Amer: 24 mL/min — ABNORMAL LOW (ref >60)
GFR calc non Af Amer: 21 mL/min — ABNORMAL LOW (ref >60)
GLUCOSE: 558 mg/dL — AB (ref 70–99)
Potassium: 4.7 mmol/L (ref 3.5–5.1)
SODIUM: 164 mmol/L — AB (ref 135–145)
Total Bilirubin: 1.6 mg/dL — ABNORMAL HIGH (ref 0.3–1.2)
Total Protein: 7 g/dL (ref 6.5–8.1)

## 2018-07-08 LAB — CBC WITH DIFFERENTIAL/PLATELET
ABS IMMATURE GRANULOCYTES: 0.04 10*3/uL (ref 0.00–0.07)
BASOS PCT: 1 %
Basophils Absolute: 0.1 10*3/uL (ref 0.0–0.1)
Eosinophils Absolute: 0.5 10*3/uL (ref 0.0–0.5)
Eosinophils Relative: 6 %
HEMATOCRIT: 56 % — AB (ref 39.0–52.0)
Hemoglobin: 16.5 g/dL (ref 13.0–17.0)
Immature Granulocytes: 0 %
Lymphocytes Relative: 14 %
Lymphs Abs: 1.3 10*3/uL (ref 0.7–4.0)
MCH: 30.5 pg (ref 26.0–34.0)
MCHC: 29.5 g/dL — ABNORMAL LOW (ref 30.0–36.0)
MCV: 103.5 fL — AB (ref 80.0–100.0)
MONO ABS: 0.6 10*3/uL (ref 0.1–1.0)
MONOS PCT: 6 %
NEUTROS ABS: 6.7 10*3/uL (ref 1.7–7.7)
Neutrophils Relative %: 73 %
PLATELETS: 109 10*3/uL — AB (ref 150–400)
RBC: 5.41 MIL/uL (ref 4.22–5.81)
RDW: 14 % (ref 11.5–15.5)
WBC: 9.2 10*3/uL (ref 4.0–10.5)
nRBC: 0 % (ref 0.0–0.2)

## 2018-07-08 LAB — GLUCOSE, CAPILLARY: Glucose-Capillary: 431 mg/dL — ABNORMAL HIGH (ref 70–99)

## 2018-07-08 MED ORDER — SODIUM CHLORIDE 0.9 % IV BOLUS
1000.0000 mL | Freq: Once | INTRAVENOUS | Status: AC
  Administered 2018-07-08: 1000 mL via INTRAVENOUS

## 2018-07-08 MED ORDER — SODIUM CHLORIDE 0.45 % IV SOLN
INTRAVENOUS | Status: DC
  Administered 2018-07-09 (×3): via INTRAVENOUS

## 2018-07-08 MED ORDER — INSULIN ASPART 100 UNIT/ML IV SOLN
10.0000 [IU] | Freq: Once | INTRAVENOUS | Status: AC
  Administered 2018-07-08: 10 [IU] via INTRAVENOUS

## 2018-07-08 MED ORDER — INSULIN ASPART 100 UNIT/ML ~~LOC~~ SOLN
SUBCUTANEOUS | Status: AC
  Filled 2018-07-08: qty 1

## 2018-07-08 MED ORDER — SODIUM CHLORIDE 0.45 % IV SOLN
INTRAVENOUS | Status: DC
  Administered 2018-07-08: 22:00:00 via INTRAVENOUS

## 2018-07-08 MED ORDER — SODIUM CHLORIDE 0.45 % IV SOLN: INTRAVENOUS | Status: DC

## 2018-07-08 NOTE — H&P (Signed)
History and Physical    Tyquan Carmickle ZOX:[id number] DOB: 1921/04/04 DOA: 07/08/2018  PCP: Charlynne Pander, MD   Patient coming from: Nursing Home.  I have personally briefly reviewed patient's old medical records in [facility]  Chief Complaint: AMS.  HPI: James Pruitt is a 84 y.o. male with medical history significant of BPH, dementia, CAD, type 2 diabetes, oral phase dysphagia, hyperlipidemia, urolithiasis, generalized muscle weakness, osteoarthritis, history of falls, impaired hearing, hypothyroidism unsteady gait who is brought to the emergency department from his nursing facility due to worsening mental status in the past 3 days.  He is unable to provide further history due to dementia and is only responding to tactile stimuli at this point.  The patient is DNR and Dr. Jeraldine Loots spoke with his son who confirmed that he is DNR.  They also agreed to treat conservatively with fluids, minimal blood work/interventions and have a hospice/palliative care evaluation while hospitalized.  ED Course: Initial vital signs in the ER temperature 97.6 F, pulse 91, respiration 4, blood pressure 125/75 mmHg and O2 sat 93% on room air.  Patient received a 1 L normal saline bolus in the emergency department.  I added NovoLog 10 units IV x1 dose and half NS 2000 mL bolus over 4 hours.  His white count was 9.2, hemoglobin 16.5 g/dL and platelets 811.  Sodium 164, potassium 4.7, chloride 129 and CO2 28 mmol/L.  Calcium 11.2, bilirubin 1.6, BUN is 96, creatinine 2.48 (double from last result) and glucose 158 mg/dL.  ALT was 83 units/L.  The rest of the CMP values are within normal limits.  Review of Systems: Unable to obtain.  Past Medical History:  Diagnosis Date  . BPH (benign prostatic hypertrophy)   . Cognitive communication deficit   . Coronary artery disease   . Coronary atherosclerosis of native coronary artery   . Dementia with behavioral problem (HCC)   . Diabetes mellitus without complication  (HCC)   . Dysphagia, oral phase   . Hyperlipemia   . Hyperlipidemia   . Kidney stones   . Muscle weakness (generalized)   . Osteoarthritis   . Personal history of fall   . Problems with hearing   . Thyroid disease   . Unsteady   . Vascular dementia with depressed mood (HCC)   . Weakness generalized     Past Surgical History:  Procedure Laterality Date  . CORONARY ARTERY BYPASS GRAFT    . surgery for kidney stones    . TOTAL KNEE ARTHROPLASTY Left   . TRANSURETHRAL RESECTION OF PROSTATE       reports that he has never smoked. He has never used smokeless tobacco. He reports that he does not drink alcohol or use drugs.  Allergies  Allergen Reactions  . Iohexol Other (See Comments)    Reaction:  Pt passed out      History reviewed. No pertinent family history.   Prior to Admission medications   Medication Sig Start Date End Date Taking? Authorizing Provider  acetaminophen (TYLENOL) 325 MG tablet Take 650 mg by mouth every 8 (eight) hours as needed for mild pain, moderate pain or headache.    Yes [provider]  aspirin EC 81 MG tablet Take 81 mg by mouth daily.   Yes [provider]  atorvastatin (LIPITOR) 20 MG tablet Take 20 mg by mouth at bedtime.    Yes [provider]  calcitRIOL (ROCALTROL) 0.25 MCG capsule Take 1 capsule (0.25 mcg total) by mouth 2 (two) times  daily. 06/09/17  Yes Roma Kayser, MD  cholecalciferol (VITAMIN D) 1000 units tablet Take 2,000 Units by mouth daily.   Yes [provider]  cinacalcet (SENSIPAR) 30 MG tablet Take 1 tablet (30 mg total) by mouth daily with breakfast. 06/09/17  Yes Nida, Denman George, MD  insulin glargine (LANTUS) 100 unit/mL SOPN Inject 24 Units into the skin daily.    Yes [provider]  levothyroxine (SYNTHROID, LEVOTHROID) 75 MCG tablet Take 75 mcg by mouth at bedtime.   Yes [provider]  polyethylene glycol (MIRALAX / GLYCOLAX) packet Take 17 g by mouth  daily.   Yes [provider]  senna-docusate (SENOKOT-S) 8.6-50 MG tablet Take 1 tablet by mouth 2 (two) times daily.    Yes [provider]  sertraline (ZOLOFT) 25 MG tablet Take 37.5 mg by mouth at bedtime.    Yes [provider]    Physical Exam: Vitals:   07/08/18 2000 07/08/18 2030 07/08/18 2100 07/08/18 2130  BP: 139/61 (!) 142/66 131/73 (!) 109/96  Pulse: 63 73 78   Resp:      Temp:      TempSrc:      SpO2: 97% 94% 95%   Weight:      Height:        Constitutional: Obtunded, only responds to sternal rub.  Eyes: PERRL, lids and conjunctivae normal ENMT: Mucous membranes and lips are very dry. Posterior pharynx clear of any exudate or lesions. Neck: normal, supple, no masses, no thyromegaly Respiratory: Decreased breath sounds on bases, but otherwise clear to auscultation bilaterally, no wheezing, no crackles. Normal respiratory effort. No accessory muscle use.  Cardiovascular: Regular rate and rhythm, no murmurs / rubs / gallops. No extremity edema. 2+ pedal pulses. No carotid bruits.  Abdomen: Soft, no tenderness, no masses palpated. No hepatosplenomegaly. Bowel sounds positive.  Musculoskeletal: no clubbing / cyanosis.  Good ROM, no contractures. Normal muscle tone.  Skin: Decreased skin turgor. Neurologic: Obtunded.  Does not follow commands.  Does not have any gross neurological deficits, but unable to fully evaluate given his obtundation. Psychiatric: Obtunded.  Does not respond to verbal stimuli.  Responds to sternal rub.  Labs on Admission: I have personally reviewed following labs and imaging studies  CBC: Recent Labs  Lab 07/08/18 1843  WBC 9.2  NEUTROABS 6.7  HGB 16.5  HCT 56.0*  MCV 103.5*  PLT 109*   Basic Metabolic Panel: Recent Labs  Lab 07/08/18 1935  NA 164*  K 4.7  CL 129*  CO2 28  GLUCOSE 558*  BUN 96*  CREATININE 2.48*  CALCIUM 11.2*   GFR: Estimated Creatinine Clearance: 16.1 mL/min (A) (by C-G formula based  on SCr of 2.48 mg/dL (H)). Liver Function Tests: Recent Labs  Lab 07/08/18 1935  AST 20  ALT 83*  ALKPHOS 90  BILITOT 1.6*  PROT 7.0  ALBUMIN 3.7   No results for input(s): LIPASE, AMYLASE in the last 168 hours. No results for input(s): AMMONIA in the last 168 hours. Coagulation Profile: No results for input(s): INR, PROTIME in the last 168 hours. Cardiac Enzymes: No results for input(s): CKTOTAL, CKMB, CKMBINDEX, TROPONINI in the last 168 hours. BNP (last 3 results) No results for input(s): PROBNP in the last 8760 hours. HbA1C: No results for input(s): HGBA1C in the last 72 hours. CBG: Recent Labs  Lab 07/08/18 2157  GLUCAP 431*   Lipid Profile: No results for input(s): CHOL, HDL, LDLCALC, TRIG, CHOLHDL, LDLDIRECT in the last 72 hours. Thyroid  Function Tests: No results for input(s): TSH, T4TOTAL, FREET4, T3FREE, THYROIDAB in the last 72 hours. Anemia Panel: No results for input(s): VITAMINB12, FOLATE, FERRITIN, TIBC, IRON, RETICCTPCT in the last 72 hours. Urine analysis:    Component Value Date/Time   COLORURINE YELLOW 06/06/2016 1952   APPEARANCEUR CLEAR 06/06/2016 1952   LABSPEC 1.025 06/06/2016 1952   PHURINE 6.0 06/06/2016 1952   GLUCOSEU NEGATIVE 06/06/2016 1952   HGBUR NEGATIVE 06/06/2016 1952   BILIRUBINUR NEGATIVE 06/06/2016 1952   KETONESUR NEGATIVE 06/06/2016 1952   PROTEINUR 100 (A) 06/06/2016 1952   UROBILINOGEN 0.2 10/27/2012 1528   NITRITE NEGATIVE 06/06/2016 1952   LEUKOCYTESUR NEGATIVE 06/06/2016 1952    Radiological Exams on Admission: No results found.  EKG: Independently reviewed.    Assessment/Plan Principal Problem:   Diabetic hyperosmolar non-ketotic state [facility]) Dr. Jeraldine Loots spoke to the family, confirmed DNR and hospice/palliative consult. Will treat conservatively with fluids and a 10 units regular insulin bolus. Continue IV fluids. CBG monitoring every 4 hours. Check CBC, CMP, magnesium phosphorus in the morning. Given his  advanced age and advanced dementia, his prognosis remains poor.  Active Problems:   Hypernatremia Measured sodium level is 164 mmol/L. Corrected sodium by Hillier's formula is 172 mmol/L. Free water deficit is 4.2 L. Will continue IV hydration with hypotonic saline. Follow-up sodium level in the morning.    AKI (acute kidney injury) (HCC)   On CKD (chronic kidney disease), stage III (HCC) Continue IV fluids. Follow-up renal function and electrolytes.    CORONARY ARTERY BYPASS GRAFT, FOUR VESSEL, HX OF Hold aspirin and atorvastatin until the patient is more alert.    Depression with anxiety   Vascular dementia with depressed mood (HCC) Hold sertraline while n.p.o.    Hypothyroidism Check TSH level. Continue levothyroxine. May need to use IV levothyroxine formulation.    Elevated bilirubin Secondary to significant dehydration. Continue IV fluids. Follow-up bilirubin in the morning.    DVT prophylaxis: SQ heparin. Code Status: Full code. Family Communication: None at bedside. Disposition Plan: Admit for IV hydration, hyperglycemia and hypernatremia management. Consults called: Palliative care medicine. Admission status: Inpatient/MedSurg.  Bobette Mo MD [facility] Pager [phone].  If 7PM-7AM, please contact night-coverage [email or web address] Password [facility]  07/08/2018, 10:30 PM   This document was prepared using Dragon voice recognition software and may contain some unintended transcription errors.

## 2018-07-08 NOTE — ED Triage Notes (Signed)
Pt sent from nursing home for abnormal labs and failure to thrive.

## 2018-07-08 NOTE — ED Notes (Signed)
CRITICAL VALUE ALERT  Critical Value: sodium 164, glucose 558  Date & Time Notied:  07/08/18 2021  Provider Notified: Dr Salina April  Orders Received/Actions taken:

## 2018-07-08 NOTE — ED Provider Notes (Addendum)
[facility] EMERGENCY DEPARTMENT Provider Note   CSN: [id number] Arrival date & time:        History   Chief Complaint Chief Complaint  Patient presents with  . Failure To Thrive    HPI James Pruitt is a 84 y.o. male.  HPI Presents from nursing facility due to listlessness. I spoke with the nursing home staff after the patient's arrival as the patient has been communicative, responds minimally to sternal rub. Reported the patient has not been participatory, eating or drinking much for at least the past 3 days, and with labs that were concerning today, he was sent here for evaluation. Level 5 caveat secondary to acuity of condition, and the patient's dementia. Staff note this patient is DNR, but after speaking with family members, and the physician on-call, he was sent here for evaluation.  Past Medical History:  Diagnosis Date  . BPH (benign prostatic hypertrophy)   . Cognitive communication deficit   . Coronary artery disease   . Coronary atherosclerosis of native coronary artery   . Dementia with behavioral problem (HCC)   . Diabetes mellitus without complication (HCC)   . Dysphagia, oral phase   . Hyperlipemia   . Hyperlipidemia   . Kidney stones   . Muscle weakness (generalized)   . Osteoarthritis   . Personal history of fall   . Problems with hearing   . Thyroid disease   . Unsteady   . Vascular dementia with depressed mood (HCC)   . Weakness generalized     Patient Active Problem List   Diagnosis Date Noted  . Other hyperparathyroidism (HCC) 06/09/2017  . Depression with anxiety 06/07/2016  . CKD (chronic kidney disease), stage III (HCC) 06/07/2016  . Hypothyroidism 06/07/2016  . Acute encephalopathy 06/06/2016  . Insulin dependent diabetes mellitus (HCC) 02/26/2009  . HYPERLIPIDEMIA-MIXED 02/26/2009  . MYOCARDIAL INFARCTION, INFERIOR WALL 02/26/2009  . CORONARY ARTERY BYPASS GRAFT, FOUR VESSEL, HX OF 02/26/2009    Past Surgical History:    Procedure Laterality Date  . CORONARY ARTERY BYPASS GRAFT    . surgery for kidney stones    . TOTAL KNEE ARTHROPLASTY Left   . TRANSURETHRAL RESECTION OF PROSTATE          Home Medications    Prior to Admission medications   Medication Sig Start Date End Date Taking? Authorizing Provider  acetaminophen (TYLENOL) 325 MG tablet Take 650 mg by mouth every 8 (eight) hours as needed for mild pain, moderate pain or headache.    Yes [provider]  aspirin EC 81 MG tablet Take 81 mg by mouth daily.   Yes [provider]  atorvastatin (LIPITOR) 20 MG tablet Take 20 mg by mouth at bedtime.    Yes [provider]  calcitRIOL (ROCALTROL) 0.25 MCG capsule Take 1 capsule (0.25 mcg total) by mouth 2 (two) times daily. 06/09/17  Yes Roma Kayser, MD  cholecalciferol (VITAMIN D) 1000 units tablet Take 2,000 Units by mouth daily.   Yes [provider]  cinacalcet (SENSIPAR) 30 MG tablet Take 1 tablet (30 mg total) by mouth daily with breakfast. 06/09/17  Yes Nida, Denman George, MD  insulin glargine (LANTUS) 100 unit/mL SOPN Inject 24 Units into the skin daily.    Yes [provider]  levothyroxine (SYNTHROID, LEVOTHROID) 75 MCG tablet Take 75 mcg by mouth at bedtime.   Yes [provider]  polyethylene glycol (MIRALAX / GLYCOLAX) packet Take 17 g by mouth daily.   Yes [provider]  senna-docusate (SENOKOT-S) 8.6-50 MG tablet Take 1 tablet by mouth 2 (two) times daily.    Yes [provider]  sertraline (ZOLOFT) 25 MG tablet Take 37.5 mg by mouth at bedtime.    Yes [provider]    Family History History reviewed. No pertinent family history.  Social History Social History   Tobacco Use  . Smoking status: Never Smoker  . Smokeless tobacco: Never Used  Substance Use Topics  . Alcohol use: No  . Drug use: No     Allergies   Iohexol   Review of Systems Review of Systems  Unable to perform  ROS: Acuity of condition     Physical Exam Updated Vital Signs BP 139/61   Pulse 63   Temp 97.6 F (36.4 C) (Oral)   Resp 12   Ht 5\' 6"  (1.676 m)   Wt 68 kg   SpO2 97%   BMI 24.21 kg/m   Physical Exam  Constitutional: He has a sickly appearance. He appears ill.  Cachectic elderly male essentially unresponsive side to some eye-opening with sternal rub.  HENT:  Head: Normocephalic and atraumatic.  Profoundly dry mucous membranes  Eyes: Conjunctivae and EOM are normal.  Cardiovascular: Normal rate and regular rhythm.  Pulmonary/Chest: Effort normal. No respiratory distress.  Abdominal: He exhibits no distension.  Scaphoid abdomen  Musculoskeletal: He exhibits no deformity.  Diffuse atrophy  Neurological: He is unresponsive.  Diffuse atrophy, patient responds only to painful stimuli, moves extremities minimally at most.  Skin: Skin is warm and dry.  Psychiatric:  Impaired  Nursing note and vitals reviewed.    ED Treatments / Results  Labs (all labs ordered are listed, but only abnormal results are displayed) Labs Reviewed  COMPREHENSIVE METABOLIC PANEL - Abnormal; Notable for the following components:      Result Value   Sodium 164 (*)    Chloride 129 (*)    Glucose, Bld 558 (*)    Creatinine, Ser 2.48 (*)    Calcium 11.2 (*)    ALT 83 (*)    Total Bilirubin 1.6 (*)    GFR calc non Af Amer 21 (*)    GFR calc Af Amer 24 (*)    All other components within normal limits  CBC WITH DIFFERENTIAL/PLATELET - Abnormal; Notable for the following components:   HCT 56.0 (*)    MCV 103.5 (*)    MCHC 29.5 (*)    Platelets 109 (*)    All other components within normal limits    Procedures Procedures (including critical care time)  Medications Ordered in ED Medications  sodium chloride 0.9 % bolus 1,000 mL (1,000 mLs Intravenous New Bag/Given 07/08/18 1941)     Initial Impression / Assessment and Plan / ED Course  I have reviewed the triage vital signs and the  nursing notes.  Pertinent labs & imaging results that were available during my care of the patient were reviewed by me and considered in my medical decision making (see chart for details).    Labs from nursing facility reviewed, notable for sodium 168, BUN 102. After initial evaluation I attempted to contact the patient's son. Subsequently patient son arrived, and I discussed his presentation.  With concern for profound dehydration the patient received IV fluids.  8:28 PM Labs consistent with report from outside hospital with critically abnormal sodium value of 164, creatinine 2.48, glucose critically abnormal as well. Patient has received IV fluids, has had no substantial change in his clinical condition. I discussed all  findings with the patient's son, who confirms that patient is DNR. He also confirms that they have no involvement with hospice thus far.  8:49 PM Patient does move his arms slightly more after initial fluid resuscitation, eyes are now open.  Patient has critical electrolyte abnormalities , though his prognosis is poor, patient has not formally enrolled in hospice care.  Patient has had some clinical improvement with fluid resuscitation, likely requires copious rehydration, with appropriate management, admission for further resuscitation.    Final Clinical Impressions(s) / ED Diagnoses  Altered mental status Hypernatremia Hyperglycemia   Gerhard Munch, MD 07/08/18 2050  CRITICAL CARE Performed by: Gerhard Munch Total critical care time: 40 minutes Critical care time was exclusive of separately billable procedures and treating other patients. Critical care was necessary to treat or prevent imminent or life-threatening deterioration. Critical care was time spent personally by me on the following activities: development of treatment plan with patient and/or surrogate as well as nursing, discussions with consultants, evaluation of patient's response to  treatment, examination of patient, obtaining history from patient or surrogate, ordering and performing treatments and interventions, ordering and review of laboratory studies, ordering and review of radiographic studies, pulse oximetry and re-evaluation of patient's condition.     Gerhard Munch, MD 07/08/18 2108

## 2018-07-08 NOTE — Progress Notes (Signed)
Pt is alone in room. Disoriented x4. Unable to answer admission questions. He is from [facility] nursing home. I called the number on the packet from [place]. His nurse is April. I was told that April works from 7am to 7pm. I asked if anyone else could help and they said no and that we would have to call back tomorrow. Will pass on info to am nurse.

## 2018-07-09 DIAGNOSIS — R17 Unspecified jaundice: Secondary | ICD-10-CM

## 2018-07-09 DIAGNOSIS — N179 Acute kidney failure, unspecified: Secondary | ICD-10-CM

## 2018-07-09 DIAGNOSIS — F0151 Vascular dementia with behavioral disturbance: Secondary | ICD-10-CM

## 2018-07-09 DIAGNOSIS — N183 Chronic kidney disease, stage 3 (moderate): Secondary | ICD-10-CM

## 2018-07-09 DIAGNOSIS — E039 Hypothyroidism, unspecified: Secondary | ICD-10-CM

## 2018-07-09 DIAGNOSIS — Z951 Presence of aortocoronary bypass graft: Secondary | ICD-10-CM

## 2018-07-09 DIAGNOSIS — F329 Major depressive disorder, single episode, unspecified: Secondary | ICD-10-CM

## 2018-07-09 DIAGNOSIS — E870 Hyperosmolality and hypernatremia: Secondary | ICD-10-CM

## 2018-07-09 LAB — COMPREHENSIVE METABOLIC PANEL
ALT: 63 U/L — ABNORMAL HIGH (ref 0–44)
AST: 17 U/L (ref 15–41)
Albumin: 3.1 g/dL — ABNORMAL LOW (ref 3.5–5.0)
Alkaline Phosphatase: 72 U/L (ref 38–126)
Anion gap: 7 (ref 5–15)
BILIRUBIN TOTAL: 1.8 mg/dL — AB (ref 0.3–1.2)
BUN: 91 mg/dL — ABNORMAL HIGH (ref 8–23)
CHLORIDE: 130 mmol/L — AB (ref 98–111)
CO2: 24 mmol/L (ref 22–32)
Calcium: 9.8 mg/dL (ref 8.9–10.3)
Creatinine, Ser: 2.08 mg/dL — ABNORMAL HIGH (ref 0.61–1.24)
GFR calc Af Amer: 30 mL/min — ABNORMAL LOW (ref >60)
GFR, EST NON AFRICAN AMERICAN: 25 mL/min — AB (ref >60)
Glucose, Bld: 378 mg/dL — ABNORMAL HIGH (ref 70–99)
POTASSIUM: 4.2 mmol/L (ref 3.5–5.1)
Sodium: 161 mmol/L (ref 135–145)
TOTAL PROTEIN: 5.7 g/dL — AB (ref 6.5–8.1)

## 2018-07-09 LAB — CBC WITH DIFFERENTIAL/PLATELET
Abs Immature Granulocytes: 0.03 10*3/uL (ref 0.00–0.07)
BASOS ABS: 0.1 10*3/uL (ref 0.0–0.1)
BASOS PCT: 1 %
Eosinophils Absolute: 0.7 10*3/uL — ABNORMAL HIGH (ref 0.0–0.5)
Eosinophils Relative: 8 %
HEMATOCRIT: 50.2 % (ref 39.0–52.0)
HEMOGLOBIN: 14.5 g/dL (ref 13.0–17.0)
IMMATURE GRANULOCYTES: 0 %
LYMPHS PCT: 18 %
Lymphs Abs: 1.5 10*3/uL (ref 0.7–4.0)
MCH: 31 pg (ref 26.0–34.0)
MCHC: 28.9 g/dL — ABNORMAL LOW (ref 30.0–36.0)
MCV: 107.3 fL — AB (ref 80.0–100.0)
MONO ABS: 0.7 10*3/uL (ref 0.1–1.0)
Monocytes Relative: 8 %
NEUTROS ABS: 5.7 10*3/uL (ref 1.7–7.7)
NRBC: 0 % (ref 0.0–0.2)
Neutrophils Relative %: 65 %
Platelets: 103 10*3/uL — ABNORMAL LOW (ref 150–400)
RBC: 4.68 MIL/uL (ref 4.22–5.81)
RDW: 13.8 % (ref 11.5–15.5)
WBC: 8.7 10*3/uL (ref 4.0–10.5)

## 2018-07-09 LAB — GLUCOSE, CAPILLARY
GLUCOSE-CAPILLARY: 332 mg/dL — AB (ref 70–99)
GLUCOSE-CAPILLARY: 261 mg/dL — AB (ref 70–99)
Glucose-Capillary: 158 mg/dL — ABNORMAL HIGH (ref 70–99)
Glucose-Capillary: 217 mg/dL — ABNORMAL HIGH (ref 70–99)
Glucose-Capillary: 345 mg/dL — ABNORMAL HIGH (ref 70–99)

## 2018-07-09 LAB — MRSA PCR SCREENING: MRSA BY PCR: NEGATIVE

## 2018-07-09 MED ORDER — INSULIN GLARGINE 100 UNIT/ML ~~LOC~~ SOLN
10.0000 [IU] | Freq: Every day | SUBCUTANEOUS | Status: DC
  Administered 2018-07-09 – 2018-07-12 (×4): 10 [IU] via SUBCUTANEOUS
  Filled 2018-07-09 (×5): qty 0.1

## 2018-07-09 MED ORDER — LEVOTHYROXINE SODIUM 100 MCG IV SOLR
37.5000 ug | Freq: Every day | INTRAVENOUS | Status: DC
  Administered 2018-07-09 – 2018-07-12 (×4): 37.5 ug via INTRAVENOUS
  Filled 2018-07-09 (×5): qty 5

## 2018-07-09 MED ORDER — ORAL CARE MOUTH RINSE
15.0000 mL | Freq: Two times a day (BID) | OROMUCOSAL | Status: DC
  Administered 2018-07-09 – 2018-07-12 (×6): 15 mL via OROMUCOSAL

## 2018-07-09 MED ORDER — INSULIN ASPART 100 UNIT/ML ~~LOC~~ SOLN
0.0000 [IU] | SUBCUTANEOUS | Status: DC
  Administered 2018-07-09: 2 [IU] via SUBCUTANEOUS
  Administered 2018-07-09: 5 [IU] via SUBCUTANEOUS
  Administered 2018-07-09 – 2018-07-10 (×2): 3 [IU] via SUBCUTANEOUS
  Administered 2018-07-10: 2 [IU] via SUBCUTANEOUS
  Administered 2018-07-10 – 2018-07-11 (×5): 3 [IU] via SUBCUTANEOUS
  Administered 2018-07-11: 2 [IU] via SUBCUTANEOUS
  Administered 2018-07-11: 1 [IU] via SUBCUTANEOUS
  Administered 2018-07-12 (×2): 2 [IU] via SUBCUTANEOUS
  Administered 2018-07-12: 3 [IU] via SUBCUTANEOUS

## 2018-07-09 MED ORDER — INSULIN ASPART 100 UNIT/ML ~~LOC~~ SOLN: 0.0000 [IU] | Freq: Three times a day (TID) | SUBCUTANEOUS | Status: DC

## 2018-07-09 NOTE — Progress Notes (Signed)
CRITICAL VALUE ALERT  Critical Value:  Sodium 161  Date & Time Notied:  07/09/18, 0439  Provider Notified: Minerva Fester, [phone]  Orders Received/Actions taken: no new orders given at this time

## 2018-07-09 NOTE — Progress Notes (Signed)
PROGRESS NOTE  James Pruitt  ZOX:[id number]  DOB: 02/13/1921  DOA: 07/08/2018 PCP: Charlynne Pander, MD   Brief Admission Hx: 84 y.o. male with medical history significant of BPH, dementia, CAD, type 2 diabetes, oral phase dysphagia, hyperlipidemia, urolithiasis, generalized muscle weakness, osteoarthritis, history of falls, impaired hearing, hypothyroidism unsteady gait who is brought to the emergency department from his nursing facility due to worsening mental status in the past 3 days.  MDM/Assessment & Plan:   1. Hyperosmolar hyperglycemia - I've ordered to start lantus 10 units daily, titrate as needed, Starting sensitive sliding scale insulin coverage every 4 hours until patient is eating better, continue IV fluid hydration.  Follow closely. Family requested that we not be aggressive and want to meet with palliative care team to discuss end of life care options.  2. Hypernatremia - Pt is severely dehydrated, continue IV fluid hydration.   3. AKI on CKD stage 3 - prerenal - treating with IV fluids.  4. CAD - holding aspirin/atorvastatin temporarily.  5. Hypothyroidism - IV levothyroxine ordered.  6. Vascular dementia - Pt is end of life with prognosis expected to be less than 2 weeks.  I have asked for palliative care consult for end of life discussion with family and symptom management.   DVT prophylaxis: hep Code Status: DNR  Family Communication: at bedside  Disposition Plan: residential hospice anticipated  Consultants:  Palliative medicine  Subjective: UTO due to patient condition  Objective: Vitals:   07/08/18 2100 07/08/18 2130 07/08/18 2330 07/09/18 0700  BP: 131/73 (!) 109/96 (!) 125/53 (!) 155/69  Pulse: 78   80  Resp:   15 14  Temp:    97.8 F (36.6 C)  TempSrc:    Oral  SpO2: 95%  100% 99%  Weight:   55 kg 56.9 kg  Height:        Intake/Output Summary (Last 24 hours) at 07/09/2018 1337 Last data filed at 07/09/2018 1154 Gross per 24 hour  Intake 2376.98 ml   Output 3 ml  Net 2373.98 ml   Filed Weights   07/08/18 1820 07/08/18 2330 07/09/18 0700  Weight: 68 kg 55 kg 56.9 kg   REVIEW OF SYSTEMS  UTO due to patient condition  Exam:  General exam: elderly debilitated severely dehydrated male, he is emaciated.  Respiratory system:  Shallow breathing bilateral.  Cardiovascular system: normal S1 & S2 heard. Gastrointestinal system: Abdomen is nondistended, soft and nontender. Normal bowel sounds heard. Central nervous system: lethargic, slow to arouse. Extremities: no cyanosis.  Data Reviewed: Basic Metabolic Panel: Recent Labs  Lab 07/08/18 1935 07/09/18 0439  NA 164* 161*  K 4.7 4.2  CL 129* 130*  CO2 28 24  GLUCOSE 558* 378*  BUN 96* 91*  CREATININE 2.48* 2.08*  CALCIUM 11.2* 9.8   Liver Function Tests: Recent Labs  Lab 07/08/18 1935 07/09/18 0439  AST 20 17  ALT 83* 63*  ALKPHOS 90 72  BILITOT 1.6* 1.8*  PROT 7.0 5.7*  ALBUMIN 3.7 3.1*   No results for input(s): LIPASE, AMYLASE in the last 168 hours. No results for input(s): AMMONIA in the last 168 hours. CBC: Recent Labs  Lab 07/08/18 1843 07/09/18 0439  WBC 9.2 8.7  NEUTROABS 6.7 5.7  HGB 16.5 14.5  HCT 56.0* 50.2  MCV 103.5* 107.3*  PLT 109* 103*   Cardiac Enzymes: No results for input(s): CKTOTAL, CKMB, CKMBINDEX, TROPONINI in the last 168 hours. CBG (last 3)  Recent Labs    07/08/18 2157 07/09/18 0003  07/09/18 0435  GLUCAP 431* 345* 332*   Recent Results (from the past 240 hour(s))  MRSA PCR Screening     Status: None   Collection Time: 07/08/18 11:46 PM  Result Value Ref Range Status   MRSA by PCR NEGATIVE NEGATIVE Final    Comment:        The GeneXpert MRSA Assay (FDA approved for NASAL specimens only), is one component of a comprehensive MRSA colonization surveillance program. It is not intended to diagnose MRSA infection nor to guide or monitor treatment for MRSA infections. Performed at [facility], [address]  [place], [place] [address]      Studies: No results found.   Scheduled Meds: . insulin aspart  0-9 Units Subcutaneous Q4H  . mouth rinse  15 mL Mouth Rinse BID   Continuous Infusions: . sodium chloride 200 mL/hr at 07/09/18 1027    Principal Problem:   Diabetic hyperosmolar non-ketotic state (HCC) Active Problems:   CORONARY ARTERY BYPASS GRAFT, FOUR VESSEL, HX OF   Depression with anxiety   CKD (chronic kidney disease), stage III (HCC)   Hypothyroidism   Elevated bilirubin   Hypernatremia   AKI (acute kidney injury) (HCC)   Vascular dementia with depressed mood (HCC)   Time spent:   Standley Dakins, MD, FAAFP [facility] Pager [phone]  If 7PM-7AM, please contact night-coverage [email or web address] Password [email or web address] 07/09/2018, 1:37 PM    LOS: 1 day

## 2018-07-09 NOTE — Progress Notes (Signed)
Call was made to [facility] and secretary Ramona stated that she was unaware of James Pruitt's admission. I asked to speak with April Bryant and Ramona told me she was not working today. After multiple attempts to connect me with the nurse on duty, I was disconnected with Ramona. Will attempt to call back.

## 2018-07-09 NOTE — Progress Notes (Signed)
Spoke with Merry Proud at [facility] and she states that Patient is a resident and his baseline is bedfast but patient talks clearly with staff but is disoriented due to the dementia. Merry Proud states that patient cannot feed himself and has been a feeder since being a resident at the facility. Merry Proud states that what we describes as the patient we see is not Mr. Gaetano's baseline. Earlier the son and daughter were at bedside asking if the doctor would order a hospice consult during this visit. The doctor was paged and made aware of this injury. No family at bedside at this time.

## 2018-07-10 LAB — URINALYSIS, ROUTINE W REFLEX MICROSCOPIC
BILIRUBIN URINE: NEGATIVE
Glucose, UA: 150 mg/dL — AB
KETONES UR: NEGATIVE mg/dL
Nitrite: NEGATIVE
PROTEIN: NEGATIVE mg/dL
SPECIFIC GRAVITY, URINE: 1.016 (ref 1.005–1.030)
pH: 5 (ref 5.0–8.0)

## 2018-07-10 LAB — BASIC METABOLIC PANEL
Anion gap: 5 (ref 5–15)
BUN: 73 mg/dL — AB (ref 8–23)
CHLORIDE: 126 mmol/L — AB (ref 98–111)
CO2: 26 mmol/L (ref 22–32)
CREATININE: 1.47 mg/dL — AB (ref 0.61–1.24)
Calcium: 9 mg/dL (ref 8.9–10.3)
GFR calc Af Amer: 45 mL/min — ABNORMAL LOW (ref >60)
GFR, EST NON AFRICAN AMERICAN: 39 mL/min — AB (ref >60)
GLUCOSE: 146 mg/dL — AB (ref 70–99)
Potassium: 3.7 mmol/L (ref 3.5–5.1)
SODIUM: 157 mmol/L — AB (ref 135–145)

## 2018-07-10 LAB — GLUCOSE, CAPILLARY
GLUCOSE-CAPILLARY: 35 mg/dL — AB (ref 70–99)
GLUCOSE-CAPILLARY: 182 mg/dL — AB (ref 70–99)
GLUCOSE-CAPILLARY: 203 mg/dL — AB (ref 70–99)
GLUCOSE-CAPILLARY: 215 mg/dL — AB (ref 70–99)
GLUCOSE-CAPILLARY: 207 mg/dL — AB (ref 70–99)
Glucose-Capillary: 75 mg/dL (ref 70–99)
Glucose-Capillary: 132 mg/dL — ABNORMAL HIGH (ref 70–99)

## 2018-07-10 MED ORDER — FLUCONAZOLE IN SODIUM CHLORIDE 200-0.9 MG/100ML-% IV SOLN
200.0000 mg | INTRAVENOUS | Status: DC
  Administered 2018-07-11 (×2): 200 mg via INTRAVENOUS
  Filled 2018-07-10 (×4): qty 100

## 2018-07-10 MED ORDER — DEXTROSE 50 % IV SOLN
INTRAVENOUS | Status: AC
  Administered 2018-07-10: 03:00:00
  Filled 2018-07-10: qty 50

## 2018-07-10 MED ORDER — SODIUM CHLORIDE 0.9 % IV SOLN
1.0000 g | INTRAVENOUS | Status: DC
  Administered 2018-07-10 – 2018-07-11 (×2): 1 g via INTRAVENOUS
  Filled 2018-07-10 (×2): qty 10
  Filled 2018-07-10: qty 1
  Filled 2018-07-10: qty 10

## 2018-07-10 MED ORDER — DEXTROSE 50 % IV SOLN
25.0000 mL | Freq: Once | INTRAVENOUS | Status: AC
  Administered 2018-07-10: 25 mL via INTRAVENOUS

## 2018-07-10 MED ORDER — DEXTROSE 5 % IV SOLN
INTRAVENOUS | Status: DC
  Administered 2018-07-10 – 2018-07-12 (×6): via INTRAVENOUS

## 2018-07-10 NOTE — Progress Notes (Addendum)
PROGRESS NOTE  James Pruitt ZOX:[id number] DOB: 09-23-1920 DOA: 07/08/2018 PCP: Charlynne Pander, MD    Brief Narrative: Her HPI :84 y.o.malewith medical history significant ofBPH, dementia, CAD, type 2 diabetes, oral phase dysphagia, hyperlipidemia, urolithiasis, generalized muscle weakness, osteoarthritis, history of falls, impaired hearing, hypothyroidism unsteady gait who is brought to the emergency department from his nursing facility due to worsening mental status in the past 3 days.   Interval history/Subjective:   Patient is nonverbal unable to obtain any information but nurse today he had no events overnight he still n.p.o.  Assessment/Plan Dysphagia patient remains n.p.o. with IV fluids we will obtain speech evaluation Type 2 diabetes mellitus.  Continue monitoring of his glucose level Dehydration continue IV fluids Vascular dementia Hypothyroidism patient on IV levothyroxine Urinary tract infection with budding yeast.  We will treat him with antibiotics Rocephin, as well as Diflucan for yeast infection  DVT prophylaxis: hep Code Status: DNR  Family Communication: at bedside  Disposition Plan: residential hospice anticipated, family wants palliative care.  Palliative consult has been requested   Dr. Barrie Folk [facility] Pager [phone]  07/10/2018, 6:49 PM  LOS: 2 days   Consultants:  Palliative medicine  Procedures:  None  Antimicrobials:  None   Objective: Vitals:  Vitals:   07/10/18 0618 07/10/18 1352  BP: (!) 151/68 (!) 143/59  Pulse: (!) 53 (!) 54  Resp: 18 18  Temp: 97.6 F (36.4 C) 97.9 F (36.6 C)  SpO2: 96% 99%    Exam:  Constitutional:  . Appears calm and comfortable Eyes:  . pupils and irises appear normal . Normal lids and conjunctivae ENMT:  . grossly normal hearing  . Lips appear normal . external ears, nose appear normal . Oropharynx: mucosa dry, tongue dry,posterior pharynx appear normal Neck:  . neck appears  normal, no masses, normal ROM, supple . no thyromegaly Respiratory:  . CTA bilaterally, no w/r/r.  . Respiratory effort normal. No retractions or accessory muscle use Cardiovascular:  . RRR, no m/r/g . No LE extremity edema   . Normal pedal pulses Abdomen:  . Abdomen appears normal; no tenderness or masses . No hernias . No HSM Musculoskeletal:  . Digits/nails BUE: no clubbing, cyanosis, petechiae, infection . exam of joints, bones, muscles of at least one of following: head/neck, RUE, LUE, RLE, LLE   o strength and tone normal, no atrophy, no abnormal movements o No tenderness, masses o Normal ROM, no contractures  . gait and station not assessed moves all 4 extremities Skin:  . No rashes, lesions, ulcers . palpation of skin: no induration or nodules Neurologic:  . CN 2-12 intact . Sensation all 4 extremities intact Psychiatric:  . Mental status o Mood, affect appropriate o Confused noncommunicative . judgment and insight NOT intact     I have personally reviewed the following:   Data: .   Scheduled Meds: . insulin aspart  0-9 Units Subcutaneous Q4H  . insulin glargine  10 Units Subcutaneous Daily  . levothyroxine  37.5 mcg Intravenous Daily  . mouth rinse  15 mL Mouth Rinse BID   Continuous Infusions: . sodium chloride 125 mL/hr at 07/09/18 1430  . dextrose 125 mL/hr at 07/10/18 1526    Principal Problem:   Diabetic hyperosmolar non-ketotic state (HCC) Active Problems:   CORONARY ARTERY BYPASS GRAFT, FOUR VESSEL, HX OF   Depression with anxiety   CKD (chronic kidney disease), stage III (HCC)   Hypothyroidism   Elevated bilirubin   Hypernatremia   AKI (acute kidney injury) (  HCC)   Vascular dementia with depressed mood (HCC)   LOS: 2 days

## 2018-07-11 DIAGNOSIS — R638 Other symptoms and signs concerning food and fluid intake: Secondary | ICD-10-CM

## 2018-07-11 DIAGNOSIS — Z7189 Other specified counseling: Secondary | ICD-10-CM

## 2018-07-11 DIAGNOSIS — Z515 Encounter for palliative care: Secondary | ICD-10-CM

## 2018-07-11 DIAGNOSIS — F0150 Vascular dementia without behavioral disturbance: Secondary | ICD-10-CM

## 2018-07-11 LAB — GLUCOSE, CAPILLARY
GLUCOSE-CAPILLARY: 147 mg/dL — AB (ref 70–99)
GLUCOSE-CAPILLARY: 227 mg/dL — AB (ref 70–99)
Glucose-Capillary: 170 mg/dL — ABNORMAL HIGH (ref 70–99)
Glucose-Capillary: 199 mg/dL — ABNORMAL HIGH (ref 70–99)
Glucose-Capillary: 231 mg/dL — ABNORMAL HIGH (ref 70–99)

## 2018-07-11 LAB — CBC WITH DIFFERENTIAL/PLATELET
ABS IMMATURE GRANULOCYTES: 0.02 10*3/uL (ref 0.00–0.07)
BASOS PCT: 0 %
Basophils Absolute: 0 10*3/uL (ref 0.0–0.1)
Eosinophils Absolute: 0.5 10*3/uL (ref 0.0–0.5)
Eosinophils Relative: 6 %
HCT: 42.2 % (ref 39.0–52.0)
Hemoglobin: 12.9 g/dL — ABNORMAL LOW (ref 13.0–17.0)
IMMATURE GRANULOCYTES: 0 %
LYMPHS ABS: 2 10*3/uL (ref 0.7–4.0)
LYMPHS PCT: 26 %
MCH: 30.4 pg (ref 26.0–34.0)
MCHC: 30.6 g/dL (ref 30.0–36.0)
MCV: 99.5 fL (ref 80.0–100.0)
MONOS PCT: 7 %
Monocytes Absolute: 0.5 10*3/uL (ref 0.1–1.0)
NEUTROS ABS: 4.5 10*3/uL (ref 1.7–7.7)
NEUTROS PCT: 61 %
PLATELETS: 92 10*3/uL — AB (ref 150–400)
RBC: 4.24 MIL/uL (ref 4.22–5.81)
RDW: 13.3 % (ref 11.5–15.5)
WBC: 7.6 10*3/uL (ref 4.0–10.5)
nRBC: 0 % (ref 0.0–0.2)

## 2018-07-11 LAB — BASIC METABOLIC PANEL
Anion gap: 5 (ref 5–15)
BUN: 54 mg/dL — ABNORMAL HIGH (ref 8–23)
CO2: 24 mmol/L (ref 22–32)
Calcium: 8.6 mg/dL — ABNORMAL LOW (ref 8.9–10.3)
Chloride: 120 mmol/L — ABNORMAL HIGH (ref 98–111)
Creatinine, Ser: 1.33 mg/dL — ABNORMAL HIGH (ref 0.61–1.24)
GFR calc Af Amer: 51 mL/min — ABNORMAL LOW (ref >60)
GFR, EST NON AFRICAN AMERICAN: 44 mL/min — AB (ref >60)
GLUCOSE: 168 mg/dL — AB (ref 70–99)
POTASSIUM: 3.5 mmol/L (ref 3.5–5.1)
Sodium: 149 mmol/L — ABNORMAL HIGH (ref 135–145)

## 2018-07-11 MED ORDER — ACETAMINOPHEN 325 MG PO TABS: 650.0000 mg | ORAL_TABLET | Freq: Four times a day (QID) | ORAL | Status: DC | PRN

## 2018-07-11 MED ORDER — MORPHINE SULFATE (CONCENTRATE) 10 MG/0.5ML PO SOLN: 2.5000 mg | ORAL | Status: DC | PRN

## 2018-07-11 MED ORDER — ACETAMINOPHEN 650 MG RE SUPP: 650.0000 mg | Freq: Four times a day (QID) | RECTAL | Status: DC | PRN

## 2018-07-11 NOTE — Evaluation (Signed)
Clinical/Bedside Swallow Evaluation Patient Details  Name: James Pruitt MRN: [id number] Date of Birth: 08-Aug-1921  Today's Date: 07/11/2018 Time: SLP Start Time (ACUTE ONLY): 1032 SLP Stop Time (ACUTE ONLY): 1051 SLP Time Calculation (min) (ACUTE ONLY): 19 min  Past Medical History:  Past Medical History:  Diagnosis Date  . BPH (benign prostatic hypertrophy)   . Cognitive communication deficit   . Coronary artery disease   . Coronary atherosclerosis of native coronary artery   . Dementia with behavioral problem (HCC)   . Diabetes mellitus without complication (HCC)   . Dysphagia, oral phase   . Hyperlipemia   . Hyperlipidemia   . Kidney stones   . Muscle weakness (generalized)   . Osteoarthritis   . Personal history of fall   . Problems with hearing   . Thyroid disease   . Unsteady   . Vascular dementia with depressed mood (HCC)   . Weakness generalized    Past Surgical History:  Past Surgical History:  Procedure Laterality Date  . CORONARY ARTERY BYPASS GRAFT    . surgery for kidney stones    . TOTAL KNEE ARTHROPLASTY Left   . TRANSURETHRAL RESECTION OF PROSTATE     HPI:  84 y.o. male with medical history significant of BPH, dementia, CAD, type 2 diabetes, oral phase dysphagia, hyperlipidemia, urolithiasis, generalized muscle weakness, osteoarthritis, history of falls, impaired hearing, hypothyroidism unsteady gait who is brought to the emergency department from his nursing facility due to worsening mental status in the past 3 days.   Assessment / Plan / Recommendation Clinical Impression  Pt was easily roused for clinical swallowing evaluation and noted to be pleasantly confused and disoriented. Oral mech reveals edentulous status, and dry crusted secretions on hard and soft palate and lingual surface; with thorough oral care discovered and removed large pieces of possibly pasta and greens that were dried to pt's hard palate. Pt consumed thin liquids without incident  via cup and straw. Prolonged AP transit was noted with puree textures. Branching up to mechanical soft textures reveals severely decreased oral coordination of solids of texture with significantly prolonged AP transit and moderate oral residue in oral cavity and bilateral sulci following the swallow. Recommend initiate D1/puree diet and thin liquids. Meds to be crushed in puree. Pt requires 1:1 feeder and/or support to facilitate self-feeding; Recommend liquid wash after bites to facilitate clearance of bolus. Acknowledge Palliative involvement - ST will continue to follow acutely for goals of care and to assess diet tolerance SLP Visit Diagnosis: Dysphagia, unspecified (R13.10)    Aspiration Risk  Mild aspiration risk    Diet Recommendation Dysphagia 1 (Puree);Thin liquid   Liquid Administration via: Straw;Cup Medication Administration: Crushed with puree Supervision: Full supervision/cueing for compensatory strategies Compensations: Minimize environmental distractions;Slow rate;Small sips/bites;Follow solids with liquid Postural Changes: Seated upright at 90 degrees;Remain upright for at least 30 minutes after po intake    Other  Recommendations Oral Care Recommendations: Oral care BID   Follow up Recommendations Skilled Nursing facility;24 hour supervision/assistance      Frequency and Duration min 1 x/week  2 weeks       Prognosis Prognosis for Safe Diet Advancement: Fair Barriers to Reach Goals: Cognitive deficits      Swallow Study   General Date of Onset: 07/08/18 HPI: 84 y.o. male with medical history significant of BPH, dementia, CAD, type 2 diabetes, oral phase dysphagia, hyperlipidemia, urolithiasis, generalized muscle weakness, osteoarthritis, history of falls, impaired hearing, hypothyroidism unsteady gait who is brought to the emergency  department from his nursing facility due to worsening mental status in the past 3 days. Type of Study: Bedside Swallow  Evaluation Previous Swallow Assessment: none in chart Diet Prior to this Study: NPO Temperature Spikes Noted: No Respiratory Status: Room air History of Recent Intubation: No Behavior/Cognition: Alert;Pleasant mood;Confused Oral Cavity Assessment: Dry;Dried secretions Oral Care Completed by SLP: Yes Oral Cavity - Dentition: Edentulous Self-Feeding Abilities: Needs assist;Total assist Patient Positioning: Upright in bed Baseline Vocal Quality: Normal Volitional Cough: Cognitively unable to elicit Volitional Swallow: Unable to elicit    Oral/Motor/Sensory Function Overall Oral Motor/Sensory Function: Within functional limits   Ice Chips Ice chips: Within functional limits   Thin Liquid Thin Liquid: Within functional limits    Nectar Thick Nectar Thick Liquid: Not tested   Honey Thick Honey Thick Liquid: Not tested   Puree Puree: Impaired Presentation: Spoon Oral Phase Impairments: Poor awareness of bolus;Reduced lingual movement/coordination   Solid     Solid: Impaired Presentation: Spoon Oral Phase Impairments: Impaired mastication;Poor awareness of bolus;Reduced lingual movement/coordination Oral Phase Functional Implications: Prolonged oral transit;Oral residue;Left lateral sulci pocketing;Right lateral sulci pocketing Pharyngeal Phase Impairments: Suspected delayed Swallow      H. Romie Levee, CCC-SLP Speech Language Pathologist  Georgetta Haber 07/11/2018,11:05 AM

## 2018-07-11 NOTE — Consult Note (Signed)
James Pruitt MRN: [id number] DOB/AGE: 1920/12/12 84 y.o. Primary Care Physician:Blass, Francis Dowse, MD Admit date: 07/08/2018 Chief Complaint:  Chief Complaint  Patient presents with  . Failure To Thrive   HPI: Pt is a 84 year old  male with past medical history of Dementia, CAD, type 2 diabetes, oral phase dysphagia who was brought to the Emergency room from his nursing facility due to worsening mental status.  HPI dates back tp past 3-4 days.when staff at Nursing home noticed worsening of his mental status.  Pt himself is unable to provide  history due to dementia and is only responding to tactile stimuli.  Upon evaluation in ER pt was found to have hemoglobin of 16.5 g/dL ,  Sodium 308,  Calcium 11.2 and AKI with creatinine of 2.48. Pt was admitted for further tx Pt seen today on 3rd floor. Pt is not able to offers any complaints.   Past Medical History:  Diagnosis Date  . BPH (benign prostatic hypertrophy)   . Cognitive communication deficit   . Coronary artery disease   . Coronary atherosclerosis of native coronary artery   . Dementia with behavioral problem (HCC)   . Diabetes mellitus without complication (HCC)   . Dysphagia, oral phase   . Hyperlipemia   . Hyperlipidemia   . Kidney stones   . Muscle weakness (generalized)   . Osteoarthritis   . Personal history of fall   . Problems with hearing   . Thyroid disease   . Unsteady   . Vascular dementia with depressed mood (HCC)   . Weakness generalized        History reviewed. No pertinent family history.  Social History:  reports that he has never smoked. He has never used smokeless tobacco. He reports that he does not drink alcohol or use drugs.   Allergies:  Allergies  Allergen Reactions  . Iohexol Other (See Comments)    Reaction:  Pt passed out      Medications Prior to Admission  Medication Sig Dispense Refill  . acetaminophen (TYLENOL) 325 MG tablet Take 650 mg by mouth every 8 (eight) hours as needed for  mild pain, moderate pain or headache.     Marland Kitchen aspirin EC 81 MG tablet Take 81 mg by mouth daily.    Marland Kitchen atorvastatin (LIPITOR) 20 MG tablet Take 20 mg by mouth at bedtime.     . calcitRIOL (ROCALTROL) 0.25 MCG capsule Take 1 capsule (0.25 mcg total) by mouth 2 (two) times daily. 60 capsule 3  . cholecalciferol (VITAMIN D) 1000 units tablet Take 2,000 Units by mouth daily.    . cinacalcet (SENSIPAR) 30 MG tablet Take 1 tablet (30 mg total) by mouth daily with breakfast. 30 tablet 3  . insulin glargine (LANTUS) 100 unit/mL SOPN Inject 24 Units into the skin daily.     . insulin lispro (HUMALOG) 100 UNIT/ML injection Inject into the skin 3 (three) times daily before meals.    Marland Kitchen levothyroxine (SYNTHROID, LEVOTHROID) 75 MCG tablet Take 75 mcg by mouth at bedtime.    . polyethylene glycol (MIRALAX / GLYCOLAX) packet Take 17 g by mouth daily.    Marland Kitchen senna-docusate (SENOKOT-S) 8.6-50 MG tablet Take 1 tablet by mouth 2 (two) times daily.     . sertraline (ZOLOFT) 25 MG tablet Take 37.5 mg by mouth at bedtime.          MVH:QIONG from the symptoms mentioned above,there are no other symptoms referable to all systems reviewed.  . insulin aspart  0-9  Units Subcutaneous Q4H  . insulin glargine  10 Units Subcutaneous Daily  . levothyroxine  37.5 mcg Intravenous Daily  . mouth rinse  15 mL Mouth Rinse BID       Physical Exam: Vital signs in last 24 hours: Temp:  [97 F (36.1 C)-98 F (36.7 C)] 97 F (36.1 C) (11/11 0432) Pulse Rate:  [50-54] 53 (11/11 0432) Resp:  [18-19] 18 (11/11 0432) BP: (141-146)/(59-83) 146/73 (11/11 0432) SpO2:  [94 %-100 %] 94 % (11/11 0432) Weight change:  Last BM Date: 07/09/18  Intake/Output from previous day: 11/10 0701 - 11/11 0700 In: 3320.9 [I.V.:3130.9; IV Piggyback:190] Out: 1000 [Urine:1000] No intake/output data recorded.   Physical Exam: General- pt is awake but doe not respond to commands Resp- No acute REsp distress,NO Rhonchi CVS- S1S2 regular in  rate and rhythm GIT- BS+, soft, NT, ND EXT- NO LE Edema, NOCyanosis CNS-  Moving all 4 extremities     Lab Results: CBC Recent Labs    07/09/18 0439 07/11/18 0432  WBC 8.7 7.6  HGB 14.5 12.9*  HCT 50.2 42.2  PLT 103* 92*    BMET Recent Labs    07/10/18 0618 07/11/18 0432  NA 157* 149*  K 3.7 3.5  CL 126* 120*  CO2 26 24  GLUCOSE 146* 168*  BUN 73* 54*  CREATININE 1.47* 1.33*  CALCIUM 9.0 8.6*   Trend Creat 2019 2.48=> 1.47=>1.33 2017 12.5--1.4 2015 2.4 2011 1.7--2.0   Sodium 2019  164=>161=>157=>149   HGb 2019  16.5=>14.5=>12.9 2011   10.6   MICRO Recent Results (from the past 240 hour(s))  MRSA PCR Screening     Status: None   Collection Time: 07/08/18 11:46 PM  Result Value Ref Range Status   MRSA by PCR NEGATIVE NEGATIVE Final    Comment:        The GeneXpert MRSA Assay (FDA approved for NASAL specimens only), is one component of a comprehensive MRSA colonization surveillance program. It is not intended to diagnose MRSA infection nor to guide or monitor treatment for MRSA infections. Performed at [facility], [address] [place], [place] [address]       Lab Results  Component Value Date   CALCIUM 8.6 (L) 07/11/2018   CAION 1.28 06/06/2016      Impression: 1)Renal  AKI secondary to Prerenal                AKI sec to Hypovolemia and Dehydration                AKI on CKD               CKD stage 3/4.               CKD since 2011               CKD secondary to Age asso decline/post renal                Progression of CKD marked with AKI            2)HTN BP stable   3)Anemia HGb at goal Pt was hemo concentrated , now better  4)CKD Mineral-Bone Disorder Hypercalcemia Now better   5)Hypernatremia Sec to inability to ask for free water Now much better PMD following  6)Thrombocytopenia Primary team following   7)Acid base Co2 at goal   8) DM- admitted with hyperglycemia    Primary team  following  Plan:  Agree with current tx and plan  Will continue current IVF as pt has responded well to tx Will follow bmet    Tarrin Lebow S 07/11/2018, 9:04 AM

## 2018-07-11 NOTE — Progress Notes (Signed)
PROGRESS NOTE  James Pruitt ZOX:[id number] DOB: [birth year] DOA: 07/08/2018 PCP: James Pander, MD    Brief Narrative: Her HPI :84 y.o.malewith medical history significant ofBPH, dementia, CAD, type 2 diabetes, oral phase dysphagia, hyperlipidemia, urolithiasis, generalized muscle weakness, osteoarthritis, history of falls, impaired hearing, hypothyroidism unsteady gait who is brought to the emergency department from his nursing facility due to worsening mental status in the past 3 days.  Patient seen at bedside.  He is more awake today he attempted to make conversation there was no event overnight   Interval history/Subjective:   Patient is nonverbal unable to obtain any information but nurse today he had no events overnight he still n.p.o.  July 11, 2018 Patient seen at bedside.  He is more awake today he attempted to make conversation there was no event overnight.  He is still n.p.o. speech was consulted.  He recommends dysphagia diet pured with thin liquid   Assessment/Plan Dysphagia patient remains n.p.o. with IV fluids speech evaluated patient today and recommended dysphagia diet.  With thin liquid via straw cup Type 2 diabetes mellitus.  Continue monitoring of his glucose level Dehydration continue IV fluids Vascular dementia palliative consult was obtained and they recommend hospice patient is from long-term facility.  She is probably discharged tomorrow with hospice Hypothyroidism patient on IV levothyroxine Urinary tract infection with budding yeast.  We will treat him with antibiotics Rocephin, as well as Diflucan for yeast infection.  DVT prophylaxis:  Heparin subcu Code Status: DNR  Family Communication: at bedside  Disposition Plan: residential hospice anticipated, family wants palliative care.  Palliative consult has been done.  Possible discharge in the morning    Consultants:  Palliative  medicine  Procedures:  None  Antimicrobials:  None   Objective: Vitals:  Vitals:   07/11/18 0432 07/11/18 1428  BP: (!) 146/73 134/61  Pulse: (!) 53 62  Resp: 18 16  Temp: (!) 97 F (36.1 C) 97.6 F (36.4 C)  SpO2: 94% 100%    Exam:  Constitutional:  . Appears calm and comfortable Eyes:  . pupils and irises appear normal . Normal lids and conjunctivae ENMT:  . grossly normal hearing  . Lips appear normal . external ears, nose appear normal . Oropharynx: mucosa dry, tongue dry,posterior pharynx appear normal Neck:  . neck appears normal, no masses, normal ROM, supple . no thyromegaly Respiratory:  . CTA bilaterally, no w/r/r.  . Respiratory effort normal. No retractions or accessory muscle use Cardiovascular:  . RRR, no m/r/g . No LE extremity edema   . Normal pedal pulses Abdomen:  . Abdomen appears normal; no tenderness or masses . No hernias . No HSM Musculoskeletal:  . Digits/nails BUE: no clubbing, cyanosis, petechiae, infection . exam of joints, bones, muscles of at least one of following: head/neck, RUE, LUE, RLE, LLE   o strength and tone normal, no atrophy, no abnormal movements o No tenderness, masses o Normal ROM, no contractures  . gait and station not assessed moves all 4 extremities Skin:  . No rashes, lesions, ulcers . palpation of skin: no induration or nodules Neurologic:  . CN 2-12 intact . Sensation all 4 extremities intact Psychiatric:  . Mental status o Mood, affect appropriate o Confused noncommunicative . judgment and insight NOT intact     I have personally reviewed the following:   Data: Results for ORON, WESTRUP (MRN [id number]) as of 07/11/2018 19:08  Ref. Range 07/11/2018 04:32  BASIC METABOLIC PANEL Unknown Rpt (A)  Sodium Latest Ref Range: 135 -  145 mmol/L 149 (H)  Potassium Latest Ref Range: 3.5 - 5.1 mmol/L 3.5  Chloride Latest Ref Range: 98 - 111 mmol/L 120 (H)  CO2 Latest Ref Range: 22 - 32 mmol/L 24   Glucose Latest Ref Range: 70 - 99 mg/dL 161 (H)  BUN Latest Ref Range: 8 - 23 mg/dL 54 (H)  Creatinine Latest Ref Range: 0.61 - 1.24 mg/dL 0.96 (H)  Calcium Latest Ref Range: 8.9 - 10.3 mg/dL 8.6 (L)  Anion gap Latest Ref Range: 5 - 15  5  GFR, Est Non African American Latest Ref Range: >60 mL/min 44 (L)  GFR, Est African American Latest Ref Range: >60 mL/min 51 (L)  WBC Latest Ref Range: 4.0 - 10.5 K/uL 7.6  RBC Latest Ref Range: 4.22 - 5.81 MIL/uL 4.24  Hemoglobin Latest Ref Range: 13.0 - 17.0 g/dL 04.5 (L)  HCT Latest Ref Range: 39.0 - 52.0 % 42.2  MCV Latest Ref Range: 80.0 - 100.0 fL 99.5  MCH Latest Ref Range: 26.0 - 34.0 pg 30.4  MCHC Latest Ref Range: 30.0 - 36.0 g/dL 40.9  RDW Latest Ref Range: 11.5 - 15.5 % 13.3  Platelets Latest Ref Range: 150 - 400 K/uL 92 (L)  nRBC Latest Ref Range: 0.0 - 0.2 % 0.0  Neutrophils Latest Units: % 61  Lymphocytes Latest Units: % 26  Monocytes Relative Latest Units: % 7  Eosinophil Latest Units: % 6  Basophil Latest Units: % 0  Immature Granulocytes Latest Units: % 0  NEUT# Latest Ref Range: 1.7 - 7.7 K/uL 4.5  Lymphocyte # Latest Ref Range: 0.7 - 4.0 K/uL 2.0  Monocyte # Latest Ref Range: 0.1 - 1.0 K/uL 0.5  Eosinophils Absolute Latest Ref Range: 0.0 - 0.5 K/uL 0.5  Basophils Absolute Latest Ref Range: 0.0 - 0.1 K/uL 0.0  Abs Immature Granulocytes Latest Ref Range: 0.00 - 0.07 K/uL 0.02  .   Scheduled Meds: . insulin aspart  0-9 Units Subcutaneous Q4H  . insulin glargine  10 Units Subcutaneous Daily  . levothyroxine  37.5 mcg Intravenous Daily  . mouth rinse  15 mL Mouth Rinse BID   Continuous Infusions: . sodium chloride 125 mL/hr at 07/09/18 1430  . cefTRIAXone (ROCEPHIN)  IV 1 g (07/11/18 1856)  . dextrose 125 mL/hr at 07/11/18 1657  . fluconazole (DIFLUCAN) IV 200 mg (07/11/18 0020)    Principal Problem:   Diabetic hyperosmolar non-ketotic state (HCC) Active Problems:   CORONARY ARTERY BYPASS GRAFT, FOUR VESSEL, HX OF    Depression with anxiety   CKD (chronic kidney disease), stage III (HCC)   Hypothyroidism   Elevated bilirubin   Hypernatremia   AKI (acute kidney injury) (HCC)   Vascular dementia with depressed mood (HCC)   Vascular dementia without behavioral disturbance (HCC)   Decreased oral intake   Goals of care, counseling/discussion   Palliative care encounter   LOS: 3 days    Dr. Barrie Folk [facility] Pager [phone]  07/11/2018, 7:01 PM  LOS: 3 days

## 2018-07-11 NOTE — Clinical Social Work Note (Signed)
Pt is a 84 year old male admitted from [facility]. Spoke with Debbie from [place] today to update. Pt is in the long term care at [facility]. He is pleasantly confused per Eunice Blase. Pt can feed himself though he needs assistance with other ADLs.   Pt's workup is in progress. Awaiting Palliative Care consult. Will follow and assist as needed with dc planning.

## 2018-07-11 NOTE — Consult Note (Signed)
Consultation Note Date: 07/11/2018   Patient Name: James Pruitt  DOB: Jan 02, 1921  MRN: [id number]  Age / Sex: 84 y.o., male  PCP: Caprice Renshaw, MD Referring Physician: Cristal Deer, MD  Reason for Consultation: Hospice Evaluation and Terminal Care  HPI/Patient Profile: 84 y.o. male  with past medical history of advanced vascular dementia, CAD, type 2 diabetes, oral phase dysphagia, hyperlipidemia, CKD stage 3, BPH, urolithiasis, osteoarthritis, history of falls with unsteady gait and generalized weakness, impaired hearing, hypothyroidism admitted from nursing home on 07/08/2018 with altered mental status in a diabetic hyperosmolar non-ketotic state and hypernatremia along with acute on chronic renal failure and elevated bilirubin. He was significantly dehydrated and found to have UTI.   Clinical Assessment and Goals of Care: I met today at James Pruitt's bedside. His 2 sons and daughter-in-law are at bedside. Family tells me how James Pruitt has been living at nursing home [facility]) for ~0.6 years now. He has gradually declined with significant decline over the past few weeks to a month. We discussed that he is sleeping more, eating/drinking less, and interacting less and less. We discussed the natural progression of dementia and expectations at EOL. They understand that prognosis is very poor. We discussed comfort care at EOL and hospice care. They would like hospice to help them ensure that Ms. Dinse is comfortable at the end of his life. They do not want aggressive measures. They understand that IVF are temporary and that without eating/drinking much prognosis is days to weeks. We discussed hospice facility and hospice at SNF. They would like to pursue hospice at SNF because their mother lives at SNF as well and this way she will be able to be with him. She continues to be in her overall right mental state but  physically needs assistance. She visits her husband 2-3 times a day typically. They say that she is aware of his decline and facing EOL. I provided my phone contact and encouraged them to call with any further questions/concerns. Offered to speak with their mother if she has any questions.   All questions/concerns addressed. Emotional support provided.   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   Primary Decision Maker NEXT OF KIN wife is alive but unable to participate in discussions. 2 out of 3 sons present for today's discussion (3rd son is not involved they say). 2 sons will speak with their mother.     SUMMARY OF RECOMMENDATIONS   - DNR - Return to SNF with hospice to pursue comfort care at EOL  Code Status/Advance Care Planning:  DNR   Symptom Management:   No current symptoms.   He does have occasional aches/pains. Recommend Tylenol for mild pains. Also may utilize heat packs, Lidoderm as needed/indicated. May utilize Roxanol SL for pain./SOB at EOL. Hospice to manage at facility.   May continue antibiotic/meds for UTI/yeast by mouth if able to tolerate po med at [facility].  Palliative Prophylaxis:   Aspiration, Bowel Regimen, Delirium Protocol, Frequent Pain Assessment, Oral Care and Turn Reposition  Additional Recommendations (Limitations, Scope, Preferences):  Full Comfort Care  Psycho-social/Spiritual:   Desire for further Chaplaincy support:no  Additional Recommendations: Caregiving  Support/Resources, Education on Hospice and Grief/Bereavement Support  Prognosis:   < 2 weeks  Discharge Planning: [place] with Hospice      Primary Diagnoses: Present on Admission: . Diabetic hyperosmolar non-ketotic state ([place]) . Elevated bilirubin . CKD (chronic kidney disease), stage III ([place]) . Hypernatremia . Depression with anxiety . Hypothyroidism . AKI  (acute kidney injury) ([place]) . Vascular dementia with depressed mood ([place])   I have reviewed the medical record, interviewed the patient and family, and examined the patient. The following aspects are pertinent.  Past Medical History:  Diagnosis Date  . BPH (benign prostatic hypertrophy)   . Cognitive communication deficit   . Coronary artery disease   . Coronary atherosclerosis of native coronary artery   . Dementia with behavioral problem ([place])   . Diabetes mellitus without complication ([place])   . Dysphagia, oral phase   . Hyperlipemia   . Hyperlipidemia   . Kidney stones   . Muscle weakness (generalized)   . Osteoarthritis   . Personal history of fall   . Problems with hearing   . Thyroid disease   . Unsteady   . Vascular dementia with depressed mood ([place])   . Weakness generalized    Social History   Socioeconomic History  . Marital status: Married    Spouse name: Not on file  . Number of children: Not on file  . Years of education: Not on file  . Highest education level: Not on file  Occupational History  . Not on file  Social Needs  . Financial resource strain: Not on file  . Food insecurity:    Worry: Not on file    Inability: Not on file  . Transportation needs:    Medical: Not on file    Non-medical: Not on file  Tobacco Use  . Smoking status: Never Smoker  . Smokeless tobacco: Never Used  Substance and Sexual Activity  . Alcohol use: No  . Drug use: No  . Sexual activity: Not on file  Lifestyle  . Physical activity:    Days per week: Not on file    Minutes per session: Not on file  . Stress: Not on file  Relationships  . Social connections:    Talks on phone: Not on file    Gets together: Not on file    Attends religious service: Not on file    Active member of club or organization: Not on file    Attends meetings of clubs or organizations: Not on file    Relationship status: Not on file  Other Topics Concern  . Not on file  Social History  Narrative  . Not on file   History reviewed. No pertinent family history. Scheduled Meds: . insulin aspart  0-9 Units Subcutaneous Q4H  . insulin glargine  10 Units Subcutaneous Daily  . levothyroxine  37.5 mcg Intravenous Daily  . mouth rinse  15 mL Mouth Rinse BID   Continuous Infusions: . sodium chloride 125 mL/hr at 07/09/18 1430  . cefTRIAXone (ROCEPHIN)  IV 1 g (07/10/18 2254)  . dextrose 125 mL/hr at 07/11/18 0832  . fluconazole (DIFLUCAN) IV 200 mg (07/11/18 0020)   PRN Meds:. Allergies  Allergen Reactions  . Iohexol Other (See Comments)  Reaction:  Pt passed out     Review of Systems  Unable to perform ROS: Dementia    Physical Exam  Constitutional: He appears well-developed. He appears lethargic. He appears ill.  Elderly, frail, muscle wasting  HENT:  Head: Normocephalic and atraumatic.  Cardiovascular: Bradycardia present.  Pulmonary/Chest: Effort normal and breath sounds normal. No accessory muscle usage. No tachypnea. No respiratory distress.  Abdominal: Soft. Normal appearance.  Neurological: He appears lethargic. He is disoriented.  Awakens briefly with much stimuli. Voice garbled and difficult to understand although he only says a word or 2 at most.   Nursing note and vitals reviewed.   Vital Signs: BP (!) 146/73 (BP Location: Left Arm)   Pulse (!) 53   Temp (!) 97 F (36.1 C) (Oral)   Resp 18   Ht 5' 6"  (1.676 m)   Wt 56.9 kg   SpO2 94%   BMI 20.25 kg/m  Pain Scale: PAINAD   Pain Score: 0-No pain   SpO2: SpO2: 94 % O2 Device:SpO2: 94 % O2 Flow Rate: .   IO: Intake/output summary:   Intake/Output Summary (Last 24 hours) at 07/11/2018 1218 Last data filed at 07/11/2018 0900 Gross per 24 hour  Intake 3320.87 ml  Output 1000 ml  Net 2320.87 ml    LBM: Last BM Date: 07/09/18 Baseline Weight: Weight: 68 kg Most recent weight: Weight: 56.9 kg     Palliative Assessment/Data: 20%     Time In: 1255 Time Out: 1405 Time Total: 70  min Greater than 50%  of this time was spent counseling and coordinating care related to the above assessment and plan.  Signed by: Vinie Sill, NP Palliative Medicine Team Pager # [phone] (M-F 8a-5p) Team Phone # [phone] (Nights/Weekends)

## 2018-07-12 DIAGNOSIS — F418 Other specified anxiety disorders: Secondary | ICD-10-CM

## 2018-07-12 DIAGNOSIS — Z7189 Other specified counseling: Secondary | ICD-10-CM

## 2018-07-12 DIAGNOSIS — R638 Other symptoms and signs concerning food and fluid intake: Secondary | ICD-10-CM

## 2018-07-12 LAB — GLUCOSE, CAPILLARY
GLUCOSE-CAPILLARY: 212 mg/dL — AB (ref 70–99)
GLUCOSE-CAPILLARY: 155 mg/dL — AB (ref 70–99)
Glucose-Capillary: 159 mg/dL — ABNORMAL HIGH (ref 70–99)
Glucose-Capillary: 166 mg/dL — ABNORMAL HIGH (ref 70–99)

## 2018-07-12 MED ORDER — MORPHINE SULFATE (CONCENTRATE) 10 MG/0.5ML PO SOLN: 2.5000 mg | ORAL | 0 refills | Status: AC | PRN

## 2018-07-12 NOTE — Progress Notes (Signed)
Pt IV removed, WNL. Report called and given to nursing staff at the [facility] facility. Family made aware of patient's discharge. Awaiting EMS transport for patient.

## 2018-07-12 NOTE — Clinical Social Work Note (Signed)
LCSW following. Per MD, pt will discharge back to [facility] today. Family has requested referral to hospice. Spoke with pt's son, Molly Maduro, by phone to confirm. Per Molly Maduro, family is agreeable to dc back to [facility] today with referral to [facility]. Updated Debbie at [facility].   Updated pt's RN who will call report and EMS when pt ready for transport.   There are no other CSW needs for dc.

## 2018-07-12 NOTE — Progress Notes (Signed)
Subjective: Interval History: none.  Patient with severe dementia and does not answer questions.  Objective: Vital signs in last 24 hours: Temp:  [97.6 F (36.4 C)-97.9 F (36.6 C)] 97.8 F (36.6 C) (11/12 0601) Pulse Rate:  [62-76] 72 (11/12 0601) Resp:  [16] 16 (11/11 1428) BP: (120-135)/(61-97) 120/63 (11/12 0601) SpO2:  [90 %-100 %] 100 % (11/12 0601) Weight change:   Intake/Output from previous day: 11/11 0701 - 11/12 0700 In: 2419.1 [P.O.:120; I.V.:2099.1; IV Piggyback:200] Out: 200 [Urine:200] Intake/Output this shift: No intake/output data recorded.  Generally patient is sleepy but arousable.  Patient however does not answer questions. Chest is clear to auscultation Heart exam regular rate and rhythm no murmur Extremities no edema  Lab Results: Recent Labs    07/11/18 0432  WBC 7.6  HGB 12.9*  HCT 42.2  PLT 92*   BMET:  Recent Labs    07/10/18 0618 07/11/18 0432  NA 157* 149*  K 3.7 3.5  CL 126* 120*  CO2 26 24  GLUCOSE 146* 168*  BUN 73* 54*  CREATININE 1.47* 1.33*  CALCIUM 9.0 8.6*   No results for input(s): PTH in the last 72 hours. Iron Studies: No results for input(s): IRON, TIBC, TRANSFERRIN, FERRITIN in the last 72 hours.  Studies/Results: No results found.  I have reviewed the patient's current medications.  Assessment/Plan: 1] acute kidney injury superimposed on chronic.  His creatinine is 1.33 his renal function has improved and is down to his baseline.  This could be secondary to prerenal syndrome superimposed on stage III chronic renal failure. 2] hypernatremia: From lack of free water.  His last sodium is 149.  Still high but has come down significantly. 3] hypercalcemia: Most likely from dehydration.  Calcium is normal  4] dementia 6] diabetes: His blood sugar seems to be slightly high overall controlled. 6] coronary artery disease Plan: 1] we will continue his present management 2] we will check renal panel in the morning 3]  decrease IV fluid to 100 cc/h   LOS: 4 days   Lauralyn Shadowens S 07/12/2018,8:07 AM

## 2018-07-12 NOTE — NC FL2 (Signed)
[place] MEDICAID [id number] LEVEL OF CARE SCREENING TOOL     IDENTIFICATION  Patient Name: James Pruitt Birthdate: 07-22-1921 Sex: male Admission Date (Current Location): 07/08/2018  [facility] and [place] Number:  [employer] and Address:  [facility],  [address], [place] [address]      Provider Number: [id number]  Attending Physician Name and Address:  Cleora Fleet, MD  Relative Name and Phone Number:       Current Level of Care: Hospital Recommended Level of Care: Skilled Nursing Facility Prior Approval Number:    Date Approved/Denied:   PASRR Number:    Discharge Plan: SNF    Current Diagnoses: Patient Active Problem List   Diagnosis Date Noted  . Vascular dementia without behavioral disturbance (HCC)   . Decreased oral intake   . Goals of care, counseling/discussion   . Palliative care encounter   . Diabetic hyperosmolar non-ketotic state (HCC) 07/08/2018  . Elevated bilirubin 07/08/2018  . Hypernatremia 07/08/2018  . AKI (acute kidney injury) (HCC) 07/08/2018  . Vascular dementia with depressed mood (HCC) 07/08/2018  . Other hyperparathyroidism (HCC) 06/09/2017  . Depression with anxiety 06/07/2016  . CKD (chronic kidney disease), stage III (HCC) 06/07/2016  . Hypothyroidism 06/07/2016  . Acute encephalopathy 06/06/2016  . Insulin dependent diabetes mellitus (HCC) 02/26/2009  . Hyperlipidemia 02/26/2009  . MYOCARDIAL INFARCTION, INFERIOR WALL 02/26/2009  . CORONARY ARTERY BYPASS GRAFT, FOUR VESSEL, HX OF 02/26/2009    Orientation RESPIRATION BLADDER Height & Weight     Self  Normal Incontinent Weight: 125 lb 7.1 oz (56.9 kg) Height:  5\' 6"  (167.6 cm)  BEHAVIORAL SYMPTOMS/MOOD NEUROLOGICAL BOWEL NUTRITION STATUS      Incontinent Diet(see dc summary)  AMBULATORY STATUS COMMUNICATION OF NEEDS Skin   Extensive Assist Verbally Normal                       Personal Care Assistance Level of Assistance  Bathing,  Feeding, Dressing Bathing Assistance: Maximum assistance Feeding assistance: Limited assistance Dressing Assistance: Maximum assistance     Functional Limitations Info  Sight, Hearing, Speech Sight Info: Adequate Hearing Info: Adequate Speech Info: Adequate    SPECIAL CARE FACTORS FREQUENCY                       Contractures Contractures Info: Not present    Additional Factors Info  Code Status, Allergies, Psychotropic Code Status Info: DNR Allergies Info: Iohexol Psychotropic Info: Zoloft         Current Medications (07/12/2018):  This is the current hospital active medication list Current Facility-Administered Medications  Medication Dose Route Frequency Provider Last Rate Last Dose  . 0.45 % sodium chloride infusion   Intravenous Continuous Salomon Mast, MD 100 mL/hr at 07/12/18 0927    . acetaminophen (TYLENOL) tablet 650 mg  650 mg Oral Q6H PRN Ulice Bold, NP       Or  . acetaminophen (TYLENOL) suppository 650 mg  650 mg Rectal Q6H PRN Ulice Bold, NP      . fluconazole (DIFLUCAN) IVPB 200 mg  200 mg Intravenous Q24H Myrtie Neither, MD   Stopped at 07/11/18 2056  . insulin aspart (novoLOG) injection 0-9 Units  0-9 Units Subcutaneous Q4H Standley Dakins L, MD   2 Units at 07/12/18 0926  . insulin glargine (LANTUS) injection 10 Units  10 Units Subcutaneous Daily Standley Dakins L, MD   10 Units at 07/12/18 0926  . levothyroxine (SYNTHROID,  LEVOTHROID) injection 37.5 mcg  37.5 mcg Intravenous Daily Johnson, Clanford L, MD   37.5 mcg at 07/12/18 0926  . MEDLINE mouth rinse  15 mL Mouth Rinse BID Bobette Mortiz, David Manuel, MD   15 mL at 07/12/18 [id number]  . morphine CONCENTRATE 10 MG/0.5ML oral solution 2.6 mg  2.6 mg Sublingual Q2H PRN Ulice BoldParker, Alicia C, NP         Discharge Medications: Please see discharge summary for a list of discharge medications.  Relevant Imaging Results:  Relevant Lab Results:   Additional Information Please refer to  hospice  Elliot GaultKathleen Trenda Corliss, LCSW

## 2018-07-12 NOTE — Progress Notes (Signed)
Palliative:  I met again today at James Pruitt's bedside. He continues to be very lethargic. He did arouse briefly (opened eyes) to tactile stimulation but was not verbal. He is resting comfortably. I called to check in with son, James Pruitt. He says that family have no further questions or concerns and are anticipating transition back to [facility] with hospice in place for EOL care. I updated him on his father's status this morning with lethargy, did not eat any breakfast (not awake enough), and reassured him that I believe hospice will take good care of his father. Emotional support provided.   Exam: Lethargic. Lying in fetal position in bed. No distress. Lungs CTA. Nonverbal. Opens eyes briefly.   Plan: - Return to SNF with hospice for EOL care.  - Prognosis days to weeks.  - Minimize medications as unlikely he will be capable of taking po meds.  - Roxanol prn for SOB/pain.   15 min  Vinie Sill, NP Palliative Medicine Team Pager # [phone] (M-F 8a-5p) Team Phone # [phone] (Nights/Weekends)

## 2018-07-12 NOTE — Care Management Important Message (Signed)
Important Message  Patient Details  Name: James Pruitt MRN: [id number] Date of Birth: 02/13/1921   Medicare Important Message Given:  Yes    Oshae Simmering, Chrystine Oiler, RN 07/12/2018, 11:52 AM

## 2018-07-12 NOTE — Discharge Summary (Signed)
Physician Discharge Summary  James Pruitt ZOX:[id number] DOB: 02-Jun-1921 DOA: 07/08/2018  PCP: Charlynne Pander, MD  Admit date: 07/08/2018 Discharge date: 07/12/2018  Admitted From: SNF  Disposition: SNF   Recommendations for SNF Symptom management per hospice protocol   Discharge Condition: HOSPICE   CODE STATUS: DNR    Brief Hospitalization Summary: Please see all hospital notes, images, labs for full details of the hospitalization. HPI: James Pruitt is a 84 y.o. male with medical history significant of BPH, dementia, CAD, type 2 diabetes, oral phase dysphagia, hyperlipidemia, urolithiasis, generalized muscle weakness, osteoarthritis, history of falls, impaired hearing, hypothyroidism unsteady gait who is brought to the emergency department from his nursing facility due to worsening mental status in the past 3 days.  He is unable to provide further history due to dementia and is only responding to tactile stimuli at this point.  The patient is DNR and Dr. Jeraldine Loots spoke with his son who confirmed that he is DNR.  They also agreed to treat conservatively with fluids, minimal blood work/interventions and have a hospice/palliative care evaluation while hospitalized.  ED Course: Initial vital signs in the ER temperature 97.6 F, pulse 91, respiration 4, blood pressure 125/75 mmHg and O2 sat 93% on room air.  Patient received a 1 L normal saline bolus in the emergency department.  I added NovoLog 10 units IV x1 dose and half NS 2000 mL bolus over 4 hours.  His white count was 9.2, hemoglobin 16.5 g/dL and platelets 811.  Sodium 164, potassium 4.7, chloride 129 and CO2 28 mmol/L.  Calcium 11.2, bilirubin 1.6, BUN is 96, creatinine 2.48 (double from last result) and glucose 158 mg/dL.  ALT was 83 units/L.  The rest of the CMP values are within normal limits.  Brief Narrative: Her HPI :84 y.o.malewith medical history significant ofBPH, dementia, CAD, type 2 diabetes, oral phase dysphagia,  hyperlipidemia, urolithiasis, generalized muscle weakness, osteoarthritis, history of falls, impaired hearing, hypothyroidism unsteady gait who is brought to the emergency department from his nursing facility due to worsening mental status in the past 3 days.  Patient seen at bedside.  He is more awake today he attempted to make conversation there was no event overnight   Interval history/Subjective:   Patient is nonverbal  Pt appears comfortable and is in no apparent distress.     Assessment/Plan Dysphagia Type 2 diabetes mellitus.  He was treated with insulin in hospital.  He is now on full comfort care.  Symptom management per hospice protocol.   Dehydration Treated with IV fluids Vascular dementia palliative consult was obtained and they recommend hospice - patient is from long-term facility.  Family has elected to pursue full comfort care.  He will discharge to SNF with hospice services.  Symptom management per hospice protocol.   Hypothyroidism - had been treated with IV levothyroxine in hospital.  Urinary tract infection with budding yeast. He was treated with Diflucan for yeast infection likely from hyperglycemia.  DVT prophylaxis: Heparin subcu Code Status:DNR Family Communication:at bedside Disposition Plan:SNF with hospice services  Consultants:  Palliative medicine  Procedures:  None  Antimicrobials:  Fluconazole    Discharge Diagnoses:  Principal Problem:   Diabetic hyperosmolar non-ketotic state (HCC) Active Problems:   CORONARY ARTERY BYPASS GRAFT, FOUR VESSEL, HX OF   Depression with anxiety   CKD (chronic kidney disease), stage III (HCC)   Hypothyroidism   Elevated bilirubin   Hypernatremia   AKI (acute kidney injury) (HCC)   Vascular dementia with depressed mood (HCC)  Vascular dementia without behavioral disturbance (HCC)   Decreased oral intake   Goals of care, counseling/discussion   Palliative care encounter  Discharge  Instructions:  Allergies as of 07/12/2018      Reactions   Iohexol Other (See Comments)   Reaction:  Pt passed out       Medication List    STOP taking these medications   aspirin EC 81 MG tablet   atorvastatin 20 MG tablet Commonly known as:  LIPITOR   calcitRIOL 0.25 MCG capsule Commonly known as:  ROCALTROL   cholecalciferol 1000 units tablet Commonly known as:  VITAMIN D   cinacalcet 30 MG tablet Commonly known as:  SENSIPAR   insulin glargine 100 unit/mL Sopn Commonly known as:  LANTUS   insulin lispro 100 UNIT/ML injection Commonly known as:  HUMALOG   polyethylene glycol packet Commonly known as:  MIRALAX / GLYCOLAX   senna-docusate 8.6-50 MG tablet Commonly known as:  Senokot-S   sertraline 25 MG tablet Commonly known as:  ZOLOFT     TAKE these medications   acetaminophen 325 MG tablet Commonly known as:  TYLENOL Take 650 mg by mouth every 8 (eight) hours as needed for mild pain, moderate pain or headache.   levothyroxine 75 MCG tablet Commonly known as:  SYNTHROID, LEVOTHROID Take 75 mcg by mouth at bedtime.   morphine CONCENTRATE 10 MG/0.5ML Soln concentrated solution Place 0.13 mLs (2.6 mg total) under the tongue every 2 (two) hours as needed for severe pain or shortness of breath.       Allergies  Allergen Reactions  . Iohexol Other (See Comments)    Reaction:  Pt passed out     Allergies as of 07/12/2018      Reactions   Iohexol Other (See Comments)   Reaction:  Pt passed out       Medication List    STOP taking these medications   aspirin EC 81 MG tablet   atorvastatin 20 MG tablet Commonly known as:  LIPITOR   calcitRIOL 0.25 MCG capsule Commonly known as:  ROCALTROL   cholecalciferol 1000 units tablet Commonly known as:  VITAMIN D   cinacalcet 30 MG tablet Commonly known as:  SENSIPAR   insulin glargine 100 unit/mL Sopn Commonly known as:  LANTUS   insulin lispro 100 UNIT/ML injection Commonly known as:   HUMALOG   polyethylene glycol packet Commonly known as:  MIRALAX / GLYCOLAX   senna-docusate 8.6-50 MG tablet Commonly known as:  Senokot-S   sertraline 25 MG tablet Commonly known as:  ZOLOFT     TAKE these medications   acetaminophen 325 MG tablet Commonly known as:  TYLENOL Take 650 mg by mouth every 8 (eight) hours as needed for mild pain, moderate pain or headache.   levothyroxine 75 MCG tablet Commonly known as:  SYNTHROID, LEVOTHROID Take 75 mcg by mouth at bedtime.   morphine CONCENTRATE 10 MG/0.5ML Soln concentrated solution Place 0.13 mLs (2.6 mg total) under the tongue every 2 (two) hours as needed for severe pain or shortness of breath.     Procedures/Studies:  No results found.   Subjective: Pt unable to speak.  Pt is severely demented.    Discharge Exam: Vitals:   07/12/18 0601 07/12/18 0821  BP: 120/63   Pulse: 72   Resp:    Temp: 97.8 F (36.6 C)   SpO2: 100% 99%   Vitals:   07/11/18 1428 07/11/18 2156 07/12/18 0601 07/12/18 0821  BP: 134/61 (!) 135/97 120/63  Pulse: 62 76 72   Resp: 16     Temp: 97.6 F (36.4 C) 97.9 F (36.6 C) 97.8 F (36.6 C)   TempSrc: Oral Oral Oral   SpO2: 100% 90% 100% 99%  Weight:      Height:        General: severely demented, NAD. Appears comfortable.  Nonverbal.   Cardiovascular: normal S1/S2 +, no rubs, no gallops Respiratory: CTA bilaterally, no wheezing, no rhonchi Abdominal: Soft, NT, ND, bowel sounds + Extremities: no edema, no cyanosis   The results of significant diagnostics from this hospitalization (including imaging, microbiology, ancillary and laboratory) are listed below for reference.     Microbiology: Recent Results (from the past 240 hour(s))  MRSA PCR Screening     Status: None   Collection Time: 07/08/18 11:46 PM  Result Value Ref Range Status   MRSA by PCR NEGATIVE NEGATIVE Final    Comment:        The GeneXpert MRSA Assay (FDA approved for NASAL specimens only), is one component  of a comprehensive MRSA colonization surveillance program. It is not intended to diagnose MRSA infection nor to guide or monitor treatment for MRSA infections. Performed at [facility], [address] [place], [place] [address]      Labs: BNP (last 3 results) No results for input(s): BNP in the last 8760 hours. Basic Metabolic Panel: Recent Labs  Lab 07/08/18 1935 07/09/18 0439 07/10/18 0618 07/11/18 0432  NA 164* 161* 157* 149*  K 4.7 4.2 3.7 3.5  CL 129* 130* 126* 120*  CO2 28 24 26 24   GLUCOSE 558* 378* 146* 168*  BUN 96* 91* 73* 54*  CREATININE 2.48* 2.08* 1.47* 1.33*  CALCIUM 11.2* 9.8 9.0 8.6*   Liver Function Tests: Recent Labs  Lab 07/08/18 1935 07/09/18 0439  AST 20 17  ALT 83* 63*  ALKPHOS 90 72  BILITOT 1.6* 1.8*  PROT 7.0 5.7*  ALBUMIN 3.7 3.1*   No results for input(s): LIPASE, AMYLASE in the last 168 hours. No results for input(s): AMMONIA in the last 168 hours. CBC: Recent Labs  Lab 07/08/18 1843 07/09/18 0439 07/11/18 0432  WBC 9.2 8.7 7.6  NEUTROABS 6.7 5.7 4.5  HGB 16.5 14.5 12.9*  HCT 56.0* 50.2 42.2  MCV 103.5* 107.3* 99.5  PLT 109* 103* 92*   Cardiac Enzymes: No results for input(s): CKTOTAL, CKMB, CKMBINDEX, TROPONINI in the last 168 hours. BNP: Invalid input(s): POCBNP CBG: Recent Labs  Lab 07/11/18 1644 07/11/18 2217 07/12/18 0200 07/12/18 0602 07/12/18 0732  GLUCAP 231* 227* 212* 159* 166*   D-Dimer No results for input(s): DDIMER in the last 72 hours. Hgb A1c No results for input(s): HGBA1C in the last 72 hours. Lipid Profile No results for input(s): CHOL, HDL, LDLCALC, TRIG, CHOLHDL, LDLDIRECT in the last 72 hours. Thyroid function studies No results for input(s): TSH, T4TOTAL, T3FREE, THYROIDAB in the last 72 hours.  Invalid input(s): FREET3 Anemia work up No results for input(s): VITAMINB12, FOLATE, FERRITIN, TIBC, IRON, RETICCTPCT in the last 72 hours. Urinalysis    Component Value Date/Time    COLORURINE YELLOW 07/10/2018 1530   APPEARANCEUR HAZY (A) 07/10/2018 1530   LABSPEC 1.016 07/10/2018 1530   PHURINE 5.0 07/10/2018 1530   GLUCOSEU 150 (A) 07/10/2018 1530   HGBUR SMALL (A) 07/10/2018 1530   BILIRUBINUR NEGATIVE 07/10/2018 1530   KETONESUR NEGATIVE 07/10/2018 1530   PROTEINUR NEGATIVE 07/10/2018 1530   UROBILINOGEN 0.2 10/27/2012 1528   NITRITE NEGATIVE 07/10/2018 1530  LEUKOCYTESUR MODERATE (A) 07/10/2018 1530   Sepsis Labs Invalid input(s): PROCALCITONIN,  WBC,  LACTICIDVEN Microbiology Recent Results (from the past 240 hour(s))  MRSA PCR Screening     Status: None   Collection Time: 07/08/18 11:46 PM  Result Value Ref Range Status   MRSA by PCR NEGATIVE NEGATIVE Final    Comment:        The GeneXpert MRSA Assay (FDA approved for NASAL specimens only), is one component of a comprehensive MRSA colonization surveillance program. It is not intended to diagnose MRSA infection nor to guide or monitor treatment for MRSA infections. Performed at [facility], [address], [place], [place] [address]    Time coordinating discharge: 34 minutes  SIGNED:  Standley Dakins, MD  [facility] 07/12/2018, 10:47 AM Pager [phone]  If 7PM-7AM, please contact night-coverage [email or web address] Password [email or web address]

## 2018-09-15 ENCOUNTER — Ambulatory Visit: Payer: Medicare Other | Admitting: "Endocrinology

## 2019-09-17 ENCOUNTER — Encounter (HOSPITAL_COMMUNITY): Payer: Self-pay | Admitting: Emergency Medicine

## 2019-09-17 ENCOUNTER — Other Ambulatory Visit: Payer: Self-pay

## 2019-09-17 ENCOUNTER — Emergency Department (HOSPITAL_COMMUNITY)
Admission: EM | Admit: 2019-09-17 | Discharge: 2019-09-17 | Disposition: A | Discharge status: Skilled Nursing Facility | Payer: Medicare Other | Attending: Emergency Medicine | Admitting: Emergency Medicine

## 2019-09-17 DIAGNOSIS — I2510 Atherosclerotic heart disease of native coronary artery without angina pectoris: Secondary | ICD-10-CM | POA: Diagnosis not present

## 2019-09-17 DIAGNOSIS — E860 Dehydration: Secondary | ICD-10-CM | POA: Insufficient documentation

## 2019-09-17 DIAGNOSIS — E039 Hypothyroidism, unspecified: Secondary | ICD-10-CM | POA: Insufficient documentation

## 2019-09-17 DIAGNOSIS — F0390 Unspecified dementia without behavioral disturbance: Secondary | ICD-10-CM | POA: Insufficient documentation

## 2019-09-17 DIAGNOSIS — E119 Type 2 diabetes mellitus without complications: Secondary | ICD-10-CM | POA: Insufficient documentation

## 2019-09-17 DIAGNOSIS — R031 Nonspecific low blood-pressure reading: Secondary | ICD-10-CM | POA: Diagnosis present

## 2019-09-17 LAB — BASIC METABOLIC PANEL
Anion gap: 11 (ref 5–15)
BUN: 53 mg/dL — ABNORMAL HIGH (ref 8–23)
CO2: 27 mmol/L (ref 22–32)
Calcium: 9.5 mg/dL (ref 8.9–10.3)
Chloride: 107 mmol/L (ref 98–111)
Creatinine, Ser: 1.65 mg/dL — ABNORMAL HIGH (ref 0.61–1.24)
GFR calc Af Amer: 40 mL/min — ABNORMAL LOW (ref >60)
GFR calc non Af Amer: 35 mL/min — ABNORMAL LOW (ref >60)
Glucose, Bld: 248 mg/dL — ABNORMAL HIGH (ref 70–99)
Potassium: 5 mmol/L (ref 3.5–5.1)
Sodium: 145 mmol/L (ref 135–145)

## 2019-09-17 LAB — CBC WITH DIFFERENTIAL/PLATELET
Abs Immature Granulocytes: 0.07 10*3/uL (ref 0.00–0.07)
Basophils Absolute: 0 10*3/uL (ref 0.0–0.1)
Basophils Relative: 0 %
Eosinophils Absolute: 0 10*3/uL (ref 0.0–0.5)
Eosinophils Relative: 0 %
HCT: 48.7 % (ref 39.0–52.0)
Hemoglobin: 15.2 g/dL (ref 13.0–17.0)
Immature Granulocytes: 1 %
Lymphocytes Relative: 5 %
Lymphs Abs: 0.5 10*3/uL — ABNORMAL LOW (ref 0.7–4.0)
MCH: 30.2 pg (ref 26.0–34.0)
MCHC: 31.2 g/dL (ref 30.0–36.0)
MCV: 96.8 fL (ref 80.0–100.0)
Monocytes Absolute: 0.7 10*3/uL (ref 0.1–1.0)
Monocytes Relative: 8 %
Neutro Abs: 7.4 10*3/uL (ref 1.7–7.7)
Neutrophils Relative %: 86 %
Platelets: 92 10*3/uL — ABNORMAL LOW (ref 150–400)
RBC: 5.03 MIL/uL (ref 4.22–5.81)
RDW: 13.6 % (ref 11.5–15.5)
WBC: 8.6 10*3/uL (ref 4.0–10.5)
nRBC: 0 % (ref 0.0–0.2)

## 2019-09-17 MED ORDER — SODIUM CHLORIDE 0.9 % IV BOLUS
1000.0000 mL | Freq: Once | INTRAVENOUS | Status: AC
  Administered 2019-09-17: 1000 mL via INTRAVENOUS

## 2019-09-17 NOTE — ED Notes (Signed)
[facility] [employer] called to set up transport for pt.

## 2019-09-17 NOTE — ED Provider Notes (Signed)
[facility] EMERGENCY DEPARTMENT Provider Note   CSN: [id number] Arrival date & time: 10-07-2019  4128     History Chief Complaint  Patient presents with  . Hypotension    James Pruitt is a 86 y.o. male.  Patient has a history of dementia.  Patient was sent over to the emergency department for low blood pressure but when he got here he was at his baseline with a normal blood pressure.  The history is provided by the nursing home. The history is limited by a language barrier.  Weakness Severity:  Mild Onset quality:  Unable to specify Timing:  Constant Progression:  Unchanged Chronicity:  Recurrent Context: not alcohol use   Relieved by:  Nothing Worsened by:  Nothing      Past Medical History:  Diagnosis Date  . BPH (benign prostatic hypertrophy)   . Cognitive communication deficit   . Coronary artery disease   . Coronary atherosclerosis of native coronary artery   . Dementia with behavioral problem (HCC)   . Diabetes mellitus without complication (HCC)   . Dysphagia, oral phase   . Hyperlipemia   . Hyperlipidemia   . Kidney stones   . Muscle weakness (generalized)   . Osteoarthritis   . Personal history of fall   . Problems with hearing   . Thyroid disease   . Unsteady   . Vascular dementia with depressed mood (HCC)   . Weakness generalized     Patient Active Problem List   Diagnosis Date Noted  . Vascular dementia without behavioral disturbance (HCC)   . Decreased oral intake   . Goals of care, counseling/discussion   . Palliative care encounter   . Diabetic hyperosmolar non-ketotic state (HCC) 07/08/2018  . Elevated bilirubin 07/08/2018  . Hypernatremia 07/08/2018  . AKI (acute kidney injury) (HCC) 07/08/2018  . Vascular dementia with depressed mood (HCC) 07/08/2018  . Other hyperparathyroidism (HCC) 06/09/2017  . Depression with anxiety 06/07/2016  . CKD (chronic kidney disease), stage III (HCC) 06/07/2016  . Hypothyroidism 06/07/2016  . Acute  encephalopathy 06/06/2016  . Insulin dependent diabetes mellitus (HCC) 02/26/2009  . Hyperlipidemia 02/26/2009  . MYOCARDIAL INFARCTION, INFERIOR WALL 02/26/2009  . CORONARY ARTERY BYPASS GRAFT, FOUR VESSEL, HX OF 02/26/2009    Past Surgical History:  Procedure Laterality Date  . CORONARY ARTERY BYPASS GRAFT    . surgery for kidney stones    . TOTAL KNEE ARTHROPLASTY Left   . TRANSURETHRAL RESECTION OF PROSTATE         History reviewed. No pertinent family history.  Social History   Tobacco Use  . Smoking status: Never Smoker  . Smokeless tobacco: Never Used  Substance Use Topics  . Alcohol use: No  . Drug use: No    Home Medications Prior to Admission medications   Medication Sig Start Date End Date Taking? Authorizing Provider  acetaminophen (TYLENOL) 325 MG tablet Take 650 mg by mouth every 8 (eight) hours as needed for mild pain, moderate pain or headache.     [provider]  levothyroxine (SYNTHROID, LEVOTHROID) 75 MCG tablet Take 75 mcg by mouth at bedtime.    [provider]  Morphine Sulfate (MORPHINE CONCENTRATE) 10 MG/0.5ML SOLN concentrated solution Place 0.13 mLs (2.6 mg total) under the tongue every 2 (two) hours as needed for severe pain or shortness of breath. 07/12/18   Cleora Fleet, MD    Allergies    Iohexol  Review of Systems   Review of Systems  Unable to perform  ROS: Dementia  Neurological: Positive for weakness.    Physical Exam Updated Vital Signs BP 139/75   Pulse 97   Resp 16   Wt 56.9 kg   SpO2 100%   BMI 20.25 kg/m   Physical Exam Vitals and nursing note reviewed.  Constitutional:      Appearance: He is well-developed. He is not ill-appearing.  HENT:     Head: Normocephalic.     Comments: Mucous membranes dry    Nose: Nose normal.  Eyes:     General: No scleral icterus.    Conjunctiva/sclera: Conjunctivae normal.  Neck:     Thyroid: No thyromegaly.  Cardiovascular:     Rate and Rhythm: Normal  rate and regular rhythm.     Heart sounds: No murmur. No friction rub. No gallop.   Pulmonary:     Breath sounds: No stridor. No wheezing or rales.  Chest:     Chest wall: No tenderness.  Abdominal:     General: There is no distension.     Tenderness: There is no abdominal tenderness. There is no rebound.  Musculoskeletal:        General: Normal range of motion.     Cervical back: Neck supple.  Lymphadenopathy:     Cervical: No cervical adenopathy.  Skin:    Findings: No erythema or rash.  Neurological:     Motor: No abnormal muscle tone.     Coordination: Coordination normal.     Comments: Patient responds to verbal stimuli but is not oriented at all     ED Results / Procedures / Treatments   Labs (all labs ordered are listed, but only abnormal results are displayed) Labs Reviewed  CBC WITH DIFFERENTIAL/PLATELET - Abnormal; Notable for the following components:      Result Value   Platelets 92 (*)    Lymphs Abs 0.5 (*)    All other components within normal limits  BASIC METABOLIC PANEL - Abnormal; Notable for the following components:   Glucose, Bld 248 (*)    BUN 53 (*)    Creatinine, Ser 1.65 (*)    GFR calc non Af Amer 35 (*)    GFR calc Af Amer 40 (*)    All other components within normal limits    EKG None  Radiology No results found.  Procedures Procedures (including critical care time)  Medications Ordered in ED Medications  sodium chloride 0.9 % bolus 1,000 mL (0 mLs Intravenous Stopped 10/15/19 1219)    ED Course  I have reviewed the triage vital signs and the nursing notes.  Pertinent labs & imaging results that were available during my care of the patient were reviewed by me and considered in my medical decision making (see chart for details).    MDM Rules/Calculators/A&P                     Patient with mild to moderate dehydration.  He is given a liter of fluids and he will be discharged home Final Clinical Impression(s) / ED  Diagnoses Final diagnoses:  Dehydration    Rx / DC Orders ED Discharge Orders    None       Milton Ferguson, MD 10-15-19 1228

## 2019-09-17 NOTE — ED Triage Notes (Signed)
Facility Staff call EMS for hypotension.  EMS reports BP 104/71, HR 104 Resp 14 and 99% on RA.  Pts is non-verbal and contracted.

## 2019-09-17 NOTE — Discharge Instructions (Addendum)
Drink plenty of fluids.  Follow up with your md this week 

## 2019-09-17 NOTE — ED Notes (Signed)
Report called to Domingo Mend, LPN at facility.

## 2019-10-02 DEATH — deceased
